# Patient Record
Sex: Male | Born: 1980 | Race: White | Hispanic: No | Marital: Single | Smoking: Former smoker
Health system: Southern US, Community
[De-identification: ages and names within clinical notes are randomized; demographics above are authoritative.]

## PROBLEM LIST (undated history)

## (undated) DIAGNOSIS — R6882 Decreased libido: Secondary | ICD-10-CM

## (undated) HISTORY — PX: APPENDECTOMY: SHX54

## (undated) HISTORY — DX: Decreased libido: R68.82

---

## 2015-05-11 ENCOUNTER — Ambulatory Visit (INDEPENDENT_AMBULATORY_CARE_PROVIDER_SITE_OTHER): Payer: Managed Care, Other (non HMO) | Admitting: Family Medicine

## 2015-05-11 ENCOUNTER — Encounter: Payer: Self-pay | Admitting: Family Medicine

## 2015-05-11 ENCOUNTER — Ambulatory Visit (INDEPENDENT_AMBULATORY_CARE_PROVIDER_SITE_OTHER): Payer: Managed Care, Other (non HMO)

## 2015-05-11 VITALS — BP 126/76 | HR 72 | Temp 98.7°F | Resp 16 | Ht 70.25 in | Wt 175.6 lb

## 2015-05-11 DIAGNOSIS — M542 Cervicalgia: Secondary | ICD-10-CM

## 2015-05-11 DIAGNOSIS — M546 Pain in thoracic spine: Secondary | ICD-10-CM

## 2015-05-11 DIAGNOSIS — M549 Dorsalgia, unspecified: Secondary | ICD-10-CM

## 2015-05-11 MED ORDER — MELOXICAM 7.5 MG PO TABS: 7.5000 mg | ORAL_TABLET | Freq: Every day | ORAL | Status: DC

## 2015-05-11 MED ORDER — CYCLOBENZAPRINE HCL 5 MG PO TABS: ORAL_TABLET | ORAL | Status: DC

## 2015-05-11 NOTE — Progress Notes (Signed)
Subjective:  By signing my name below, I, Stann Ore, attest that this documentation has been prepared under the direction and in the presence of [name], MD. Electronically Signed: Stann Ore, Scribe. 05/11/2015 , 3:32 PM .  Patient was seen in Room 26 .   Patient ID: Michael Castaneda, male    DOB: 02-10-1981, 35 y.o.   MRN: [id number] Chief Complaint  Patient presents with  . right upper back muscle pain    x 2 yrs, "just got insurance to be able to come in"   HPI Michael Castaneda is a 35 y.o. male Pt is here with upper right myalgia in his shoulder for the past 2 years. He was doing a deadlift at the gym and felt a discomfort when he was coming up. He still does exercises but just painful. He notices most when he lifts heavier weights and using the shoulder. He's taken ibuprofen qd and tylenol without relief. He's also tried applying bengay cream without relief. He's also tried massage therapy without relief. He also mentions going to "[facility]" for his spine without resolve. He denies imaging done on the area. He denies pain radiating down his arm or to his neck. He hasn't seen a doctor for this due to having no insurance.   He's right handed.  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   There are no active problems to display for this patient.  History reviewed. No pertinent past medical history. Past Surgical History  Procedure Laterality Date  . Appendectomy     No Known Allergies Prior to Admission medications   Not on File   Social History   Social History  . Marital Status: Single    Spouse Name: N/A  . Number of Children: N/A  . Years of Education: N/A   Occupational History  . Not on file.   Social History Main Topics  . Smoking status: Current Every Day Smoker  . Smokeless tobacco: Not on file  . Alcohol Use: No  . Drug Use: No  . Sexual Activity: Not on file   Other Topics Concern  . Not on file   Social History  Narrative  . No narrative on file   Review of Systems  Constitutional: Negative for fever, chills, diaphoresis and fatigue.  Musculoskeletal: Positive for myalgias and arthralgias (right shoulder). Negative for back pain, joint swelling, neck pain and neck stiffness.  Skin: Negative for rash and wound.  Neurological: Negative for light-headedness and headaches.      Objective:   Physical Exam  Constitutional: He is oriented to person, place, and time. He appears well-developed and well-nourished. No distress.  HENT:  Head: Normocephalic and atraumatic.  Eyes: EOM are normal. Pupils are equal, round, and reactive to light.  Neck: Neck supple.  Cardiovascular: Normal rate.   Pulmonary/Chest: Effort normal. No respiratory distress.  Musculoskeletal: Normal range of motion.  c-spine exam: full extension and flexion, left rotation and right rotation intact, slight pinch lateral flexion neck, no bony tenderness Right shoulder: No scapula winging, equal and normal scapulothoracic motion and no apparent periscapular atrophy Upper extremity: strength intact bilaterally, NVI distally  Neurological: He is alert and oriented to person, place, and time.  Reflex Scores:      Tricep reflexes are 1+ on the right side and 1+ on the left side.      Bicep reflexes are 1+ on the right side and 1+ on the left side. 1+ biceps triceps reflexes but  equal   Skin: Skin is warm and dry.  Psychiatric: He has a normal mood and affect. His behavior is normal.  Nursing note and vitals reviewed.   Filed Vitals:   05/11/15 1443  BP: 126/76  Pulse: 72  Temp: 98.7 F (37.1 C)  TempSrc: Oral  Resp: 16  Height: 5' 10.25" (1.784 m)  Weight: 175 lb 9.6 oz (79.652 kg)  SpO2: 97%   [facility] reading (PRIMARY) by Dr. Neva Seat : c-spine xray: some straightening of upper cervical spine with some diffuse degenerative changes, no apparent fractures     Assessment & Plan:    Michael Castaneda is a 35 y.o. male Upper back  pain on right side - Plan: DG Cervical Spine 2 or 3 views, meloxicam (MOBIC) 7.5 MG tablet, cyclobenzaprine (FLEXERIL) 5 MG tablet  Neck pain on right side - Plan: DG Cervical Spine 2 or 3 views, meloxicam (MOBIC) 7.5 MG tablet, cyclobenzaprine (FLEXERIL) 5 MG tablet  Possible C-spine source versus strain upper thoracic spine. No apparent supraspinatus or rhomboid atrophy, full range of motion, strength appears intact. Will try meloxicam every morning, Flexeril at night as needed. Side effects discussed, other symptomatic care discussed as well as progressing to home exercises on handout. Consider formal physical therapy or orthopedics if not improving the next 4-6 weeks. Sooner if worse.  Meds ordered this encounter  Medications  . meloxicam (MOBIC) 7.5 MG tablet    Sig: Take 1 tablet (7.5 mg total) by mouth daily.    Dispense:  30 tablet    Refill:  0  . cyclobenzaprine (FLEXERIL) 5 MG tablet    Sig: 1 pill by mouth up to every 8 hours as needed. Start with one pill by mouth each bedtime as needed due to sedation    Dispense:  15 tablet    Refill:  0   Patient Instructions  You likely had a sprained ligament or strained muscle in the upper back or possible pinched nerve from neck, which can lead to some muscle spasm as well. Try the mobic each morning (do not combine with other over the counter pain relievers), flexeril at night if needed.  Heat or ice to area as needed and gentle range of motion/stretching throughout the day. You can also try to progress to exercises below as tolerated. If not improving in next 4-6 weeks, I can refer you to orthopaedist or physical therapy. Return to the clinic or go to the nearest emergency room if any of your symptoms worsen or new symptoms occur.  Follow up to discuss other concern form today so we can determine if blood work or other testing/medication indicated.   Cervical Strain and Sprain With Rehab Cervical strain and sprain are injuries that  commonly occur with "whiplash" injuries. Whiplash occurs when the neck is forcefully whipped backward or forward, such as during a motor vehicle accident or during contact sports. The muscles, ligaments, tendons, discs, and nerves of the neck are susceptible to injury when this occurs. RISK FACTORS Risk of having a whiplash injury increases if:  Osteoarthritis of the spine.  Situations that make head or neck accidents or trauma more likely.  High-risk sports (football, rugby, wrestling, hockey, auto racing, gymnastics, diving, contact karate, or boxing).  Poor strength and flexibility of the neck.  Previous neck injury.  Poor tackling technique.  Improperly fitted or padded equipment. SYMPTOMS   Pain or stiffness in the front or back of neck or both.  Symptoms may present immediately or up to  24 hours after injury.  Dizziness, headache, nausea, and vomiting.  Muscle spasm with soreness and stiffness in the neck.  Tenderness and swelling at the injury site. PREVENTION  Learn and use proper technique (avoid tackling with the head, spearing, and head-butting; use proper falling techniques to avoid landing on the head).  Warm up and stretch properly before activity.  Maintain physical fitness:  Strength, flexibility, and endurance.  Cardiovascular fitness.  Wear properly fitted and padded protective equipment, such as padded soft collars, for participation in contact sports. PROGNOSIS  Recovery from cervical strain and sprain injuries is dependent on the extent of the injury. These injuries are usually curable in 1 week to 3 months with appropriate treatment.  RELATED COMPLICATIONS   Temporary numbness and weakness may occur if the nerve roots are damaged, and this may persist until the nerve has completely healed.  Chronic pain due to frequent recurrence of symptoms.  Prolonged healing, especially if activity is resumed too soon (before complete recovery). TREATMENT    Treatment initially involves the use of ice and medication to help reduce pain and inflammation. It is also important to perform strengthening and stretching exercises and modify activities that worsen symptoms so the injury does not get worse. These exercises may be performed at home or with a therapist. For patients who experience severe symptoms, a soft, padded collar may be recommended to be worn around the neck.  Improving your posture may help reduce symptoms. Posture improvement includes pulling your chin and abdomen in while sitting or standing. If you are sitting, sit in a firm chair with your buttocks against the back of the chair. While sleeping, try replacing your pillow with a small towel rolled to 2 inches in diameter, or use a cervical pillow or soft cervical collar. Poor sleeping positions delay healing.  For patients with nerve root damage, which causes numbness or weakness, the use of a cervical traction apparatus may be recommended. Surgery is rarely necessary for these injuries. However, cervical strain and sprains that are present at birth (congenital) may require surgery. MEDICATION   If pain medication is necessary, nonsteroidal anti-inflammatory medications, such as aspirin and ibuprofen, or other minor pain relievers, such as acetaminophen, are often recommended.  Do not take pain medication for 7 days before surgery.  Prescription pain relievers may be given if deemed necessary by your caregiver. Use only as directed and only as much as you need. HEAT AND COLD:   Cold treatment (icing) relieves pain and reduces inflammation. Cold treatment should be applied for 10 to 15 minutes every 2 to 3 hours for inflammation and pain and immediately after any activity that aggravates your symptoms. Use ice packs or an ice massage.  Heat treatment may be used prior to performing the stretching and strengthening activities prescribed by your caregiver, physical therapist, or athletic  trainer. Use a heat pack or a warm soak. SEEK MEDICAL CARE IF:   Symptoms get worse or do not improve in 2 weeks despite treatment.  New, unexplained symptoms develop (drugs used in treatment may produce side effects). EXERCISES RANGE OF MOTION (ROM) AND STRETCHING EXERCISES - Cervical Strain and Sprain These exercises may help you when beginning to rehabilitate your injury. In order to successfully resolve your symptoms, you must improve your posture. These exercises are designed to help reduce the forward-head and rounded-shoulder posture which contributes to this condition. Your symptoms may resolve with or without further involvement from your physician, physical therapist or athletic trainer. While completing  these exercises, remember:   Restoring tissue flexibility helps normal motion to return to the joints. This allows healthier, less painful movement and activity.  An effective stretch should be held for at least 20 seconds, although you may need to begin with shorter hold times for comfort.  A stretch should never be painful. You should only feel a gentle lengthening or release in the stretched tissue. STRETCH- Axial Extensors  Lie on your back on the floor. You may bend your knees for comfort. Place a rolled-up hand towel or dish towel, about 2 inches in diameter, under the part of your head that makes contact with the floor.  Gently tuck your chin, as if trying to make a "double chin," until you feel a gentle stretch at the base of your head.  Hold __________ seconds. Repeat __________ times. Complete this exercise __________ times per day.  STRETCH - Axial Extension   Stand or sit on a firm surface. Assume a good posture: chest up, shoulders drawn back, abdominal muscles slightly tense, knees unlocked (if standing) and feet hip width apart.  Slowly retract your chin so your head slides back and your chin slightly lowers. Continue to look straight ahead.  You should feel a  gentle stretch in the back of your head. Be certain not to feel an aggressive stretch since this can cause headaches later.  Hold for __________ seconds. Repeat __________ times. Complete this exercise __________ times per day. STRETCH - Cervical Side Bend   Stand or sit on a firm surface. Assume a good posture: chest up, shoulders drawn back, abdominal muscles slightly tense, knees unlocked (if standing) and feet hip width apart.  Without letting your nose or shoulders move, slowly tip your right / left ear to your shoulder until your feel a gentle stretch in the muscles on the opposite side of your neck.  Hold __________ seconds. Repeat __________ times. Complete this exercise __________ times per day. STRETCH - Cervical Rotators   Stand or sit on a firm surface. Assume a good posture: chest up, shoulders drawn back, abdominal muscles slightly tense, knees unlocked (if standing) and feet hip width apart.  Keeping your eyes level with the ground, slowly turn your head until you feel a gentle stretch along the back and opposite side of your neck.  Hold __________ seconds. Repeat __________ times. Complete this exercise __________ times per day. RANGE OF MOTION - Neck Circles   Stand or sit on a firm surface. Assume a good posture: chest up, shoulders drawn back, abdominal muscles slightly tense, knees unlocked (if standing) and feet hip width apart.  Gently roll your head down and around from the back of one shoulder to the back of the other. The motion should never be forced or painful.  Repeat the motion 10-20 times, or until you feel the neck muscles relax and loosen. Repeat __________ times. Complete the exercise __________ times per day. STRENGTHENING EXERCISES - Cervical Strain and Sprain These exercises may help you when beginning to rehabilitate your injury. They may resolve your symptoms with or without further involvement from your physician, physical therapist, or athletic  trainer. While completing these exercises, remember:   Muscles can gain both the endurance and the strength needed for everyday activities through controlled exercises.  Complete these exercises as instructed by your physician, physical therapist, or athletic trainer. Progress the resistance and repetitions only as guided.  You may experience muscle soreness or fatigue, but the pain or discomfort you are trying to eliminate  should never worsen during these exercises. If this pain does worsen, stop and make certain you are following the directions exactly. If the pain is still present after adjustments, discontinue the exercise until you can discuss the trouble with your clinician. STRENGTH - Cervical Flexors, Isometric  Face a wall, standing about 6 inches away. Place a small pillow, a ball about 6-8 inches in diameter, or a folded towel between your forehead and the wall.  Slightly tuck your chin and gently push your forehead into the soft object. Push only with mild to moderate intensity, building up tension gradually. Keep your jaw and forehead relaxed.  Hold 10 to 20 seconds. Keep your breathing relaxed.  Release the tension slowly. Relax your neck muscles completely before you start the next repetition. Repeat __________ times. Complete this exercise __________ times per day. STRENGTH- Cervical Lateral Flexors, Isometric   Stand about 6 inches away from a wall. Place a small pillow, a ball about 6-8 inches in diameter, or a folded towel between the side of your head and the wall.  Slightly tuck your chin and gently tilt your head into the soft object. Push only with mild to moderate intensity, building up tension gradually. Keep your jaw and forehead relaxed.  Hold 10 to 20 seconds. Keep your breathing relaxed.  Release the tension slowly. Relax your neck muscles completely before you start the next repetition. Repeat __________ times. Complete this exercise __________ times per  day. STRENGTH - Cervical Extensors, Isometric   Stand about 6 inches away from a wall. Place a small pillow, a ball about 6-8 inches in diameter, or a folded towel between the back of your head and the wall.  Slightly tuck your chin and gently tilt your head back into the soft object. Push only with mild to moderate intensity, building up tension gradually. Keep your jaw and forehead relaxed.  Hold 10 to 20 seconds. Keep your breathing relaxed.  Release the tension slowly. Relax your neck muscles completely before you start the next repetition. Repeat __________ times. Complete this exercise __________ times per day. POSTURE AND BODY MECHANICS CONSIDERATIONS - Cervical Strain and Sprain Keeping correct posture when sitting, standing or completing your activities will reduce the stress put on different body tissues, allowing injured tissues a chance to heal and limiting painful experiences. The following are general guidelines for improved posture. Your physician or physical therapist will provide you with any instructions specific to your needs. While reading these guidelines, remember:  The exercises prescribed by your provider will help you have the flexibility and strength to maintain correct postures.  The correct posture provides the optimal environment for your joints to work. All of your joints have less wear and tear when properly supported by a spine with good posture. This means you will experience a healthier, less painful body.  Correct posture must be practiced with all of your activities, especially prolonged sitting and standing. Correct posture is as important when doing repetitive low-stress activities (typing) as it is when doing a single heavy-load activity (lifting). PROLONGED STANDING WHILE SLIGHTLY LEANING FORWARD When completing a task that requires you to lean forward while standing in one place for a long time, place either foot up on a stationary 2- to 4-inch high object  to help maintain the best posture. When both feet are on the ground, the low back tends to lose its slight inward curve. If this curve flattens (or becomes too large), then the back and your other joints will experience  too much stress, fatigue more quickly, and can cause pain.  RESTING POSITIONS Consider which positions are most painful for you when choosing a resting position. If you have pain with flexion-based activities (sitting, bending, stooping, squatting), choose a position that allows you to rest in a less flexed posture. You would want to avoid curling into a fetal position on your side. If your pain worsens with extension-based activities (prolonged standing, working overhead), avoid resting in an extended position such as sleeping on your stomach. Most people will find more comfort when they rest with their spine in a more neutral position, neither too rounded nor too arched. Lying on a non-sagging bed on your side with a pillow between your knees, or on your back with a pillow under your knees will often provide some relief. Keep in mind, being in any one position for a prolonged period of time, no matter how correct your posture, can still lead to stiffness. WALKING Walk with an upright posture. Your ears, shoulders, and hips should all line up. OFFICE WORK When working at a desk, create an environment that supports good, upright posture. Without extra support, muscles fatigue and lead to excessive strain on joints and other tissues. CHAIR:  A chair should be able to slide under your desk when your back makes contact with the back of the chair. This allows you to work closely.  The chair's height should allow your eyes to be level with the upper part of your monitor and your hands to be slightly lower than your elbows.  Body position:  Your feet should make contact with the floor. If this is not possible, use a foot rest.  Keep your ears over your shoulders. This will reduce stress on  your neck and low back.   This information is not intended to replace advice given to you by your health care provider. Make sure you discuss any questions you have with your health care provider.   Document Released: 03/28/2005 Document Revised: 04/18/2014 Document Reviewed: 07/10/2008 [employer] Interactive Patient Education [employer]   Because you received an x-ray today, you will receive an invoice from [facility]. Please contact [facility] at [phone] with questions or concerns regarding your invoice. Our billing staff will not be able to assist you with those questions.     I personally performed the services described in this documentation, which was scribed in my presence. The recorded information has been reviewed and considered, and addended by me as needed.

## 2015-05-11 NOTE — Patient Instructions (Addendum)
You likely had a sprained ligament or strained muscle in the upper back or possible pinched nerve from neck, which can lead to some muscle spasm as well. Try the mobic each morning (do not combine with other over the counter pain relievers), flexeril at night if needed.  Heat or ice to area as needed and gentle range of motion/stretching throughout the day. You can also try to progress to exercises below as tolerated. If not improving in next 4-6 weeks, I can refer you to orthopaedist or physical therapy. Return to the clinic or go to the nearest emergency room if any of your symptoms worsen or new symptoms occur.  Follow up to discuss other concern form today so we can determine if blood work or other testing/medication indicated.   Cervical Strain and Sprain With Rehab Cervical strain and sprain are injuries that commonly occur with "whiplash" injuries. Whiplash occurs when the neck is forcefully whipped backward or forward, such as during a motor vehicle accident or during contact sports. The muscles, ligaments, tendons, discs, and nerves of the neck are susceptible to injury when this occurs. RISK FACTORS Risk of having a whiplash injury increases if:  Osteoarthritis of the spine.  Situations that make head or neck accidents or trauma more likely.  High-risk sports (football, rugby, wrestling, hockey, auto racing, gymnastics, diving, contact karate, or boxing).  Poor strength and flexibility of the neck.  Previous neck injury.  Poor tackling technique.  Improperly fitted or padded equipment. SYMPTOMS   Pain or stiffness in the front or back of neck or both.  Symptoms may present immediately or up to 24 hours after injury.  Dizziness, headache, nausea, and vomiting.  Muscle spasm with soreness and stiffness in the neck.  Tenderness and swelling at the injury site. PREVENTION  Learn and use proper technique (avoid tackling with the head, spearing, and head-butting; use proper  falling techniques to avoid landing on the head).  Warm up and stretch properly before activity.  Maintain physical fitness:  Strength, flexibility, and endurance.  Cardiovascular fitness.  Wear properly fitted and padded protective equipment, such as padded soft collars, for participation in contact sports. PROGNOSIS  Recovery from cervical strain and sprain injuries is dependent on the extent of the injury. These injuries are usually curable in 1 week to 3 months with appropriate treatment.  RELATED COMPLICATIONS   Temporary numbness and weakness may occur if the nerve roots are damaged, and this may persist until the nerve has completely healed.  Chronic pain due to frequent recurrence of symptoms.  Prolonged healing, especially if activity is resumed too soon (before complete recovery). TREATMENT  Treatment initially involves the use of ice and medication to help reduce pain and inflammation. It is also important to perform strengthening and stretching exercises and modify activities that worsen symptoms so the injury does not get worse. These exercises may be performed at home or with a therapist. For patients who experience severe symptoms, a soft, padded collar may be recommended to be worn around the neck.  Improving your posture may help reduce symptoms. Posture improvement includes pulling your chin and abdomen in while sitting or standing. If you are sitting, sit in a firm chair with your buttocks against the back of the chair. While sleeping, try replacing your pillow with a small towel rolled to 2 inches in diameter, or use a cervical pillow or soft cervical collar. Poor sleeping positions delay healing.  For patients with nerve root damage, which causes numbness or  weakness, the use of a cervical traction apparatus may be recommended. Surgery is rarely necessary for these injuries. However, cervical strain and sprains that are present at birth (congenital) may require  surgery. MEDICATION   If pain medication is necessary, nonsteroidal anti-inflammatory medications, such as aspirin and ibuprofen, or other minor pain relievers, such as acetaminophen, are often recommended.  Do not take pain medication for 7 days before surgery.  Prescription pain relievers may be given if deemed necessary by your caregiver. Use only as directed and only as much as you need. HEAT AND COLD:   Cold treatment (icing) relieves pain and reduces inflammation. Cold treatment should be applied for 10 to 15 minutes every 2 to 3 hours for inflammation and pain and immediately after any activity that aggravates your symptoms. Use ice packs or an ice massage.  Heat treatment may be used prior to performing the stretching and strengthening activities prescribed by your caregiver, physical therapist, or athletic trainer. Use a heat pack or a warm soak. SEEK MEDICAL CARE IF:   Symptoms get worse or do not improve in 2 weeks despite treatment.  New, unexplained symptoms develop (drugs used in treatment may produce side effects). EXERCISES RANGE OF MOTION (ROM) AND STRETCHING EXERCISES - Cervical Strain and Sprain These exercises may help you when beginning to rehabilitate your injury. In order to successfully resolve your symptoms, you must improve your posture. These exercises are designed to help reduce the forward-head and rounded-shoulder posture which contributes to this condition. Your symptoms may resolve with or without further involvement from your physician, physical therapist or athletic trainer. While completing these exercises, remember:   Restoring tissue flexibility helps normal motion to return to the joints. This allows healthier, less painful movement and activity.  An effective stretch should be held for at least 20 seconds, although you may need to begin with shorter hold times for comfort.  A stretch should never be painful. You should only feel a gentle lengthening or  release in the stretched tissue. STRETCH- Axial Extensors  Lie on your back on the floor. You may bend your knees for comfort. Place a rolled-up hand towel or dish towel, about 2 inches in diameter, under the part of your head that makes contact with the floor.  Gently tuck your chin, as if trying to make a "double chin," until you feel a gentle stretch at the base of your head.  Hold __________ seconds. Repeat __________ times. Complete this exercise __________ times per day.  STRETCH - Axial Extension   Stand or sit on a firm surface. Assume a good posture: chest up, shoulders drawn back, abdominal muscles slightly tense, knees unlocked (if standing) and feet hip width apart.  Slowly retract your chin so your head slides back and your chin slightly lowers. Continue to look straight ahead.  You should feel a gentle stretch in the back of your head. Be certain not to feel an aggressive stretch since this can cause headaches later.  Hold for __________ seconds. Repeat __________ times. Complete this exercise __________ times per day. STRETCH - Cervical Side Bend   Stand or sit on a firm surface. Assume a good posture: chest up, shoulders drawn back, abdominal muscles slightly tense, knees unlocked (if standing) and feet hip width apart.  Without letting your nose or shoulders move, slowly tip your right / left ear to your shoulder until your feel a gentle stretch in the muscles on the opposite side of your neck.  Hold __________ seconds.  Repeat __________ times. Complete this exercise __________ times per day. STRETCH - Cervical Rotators   Stand or sit on a firm surface. Assume a good posture: chest up, shoulders drawn back, abdominal muscles slightly tense, knees unlocked (if standing) and feet hip width apart.  Keeping your eyes level with the ground, slowly turn your head until you feel a gentle stretch along the back and opposite side of your neck.  Hold __________  seconds. Repeat __________ times. Complete this exercise __________ times per day. RANGE OF MOTION - Neck Circles   Stand or sit on a firm surface. Assume a good posture: chest up, shoulders drawn back, abdominal muscles slightly tense, knees unlocked (if standing) and feet hip width apart.  Gently roll your head down and around from the back of one shoulder to the back of the other. The motion should never be forced or painful.  Repeat the motion 10-20 times, or until you feel the neck muscles relax and loosen. Repeat __________ times. Complete the exercise __________ times per day. STRENGTHENING EXERCISES - Cervical Strain and Sprain These exercises may help you when beginning to rehabilitate your injury. They may resolve your symptoms with or without further involvement from your physician, physical therapist, or athletic trainer. While completing these exercises, remember:   Muscles can gain both the endurance and the strength needed for everyday activities through controlled exercises.  Complete these exercises as instructed by your physician, physical therapist, or athletic trainer. Progress the resistance and repetitions only as guided.  You may experience muscle soreness or fatigue, but the pain or discomfort you are trying to eliminate should never worsen during these exercises. If this pain does worsen, stop and make certain you are following the directions exactly. If the pain is still present after adjustments, discontinue the exercise until you can discuss the trouble with your clinician. STRENGTH - Cervical Flexors, Isometric  Face a wall, standing about 6 inches away. Place a small pillow, a ball about 6-8 inches in diameter, or a folded towel between your forehead and the wall.  Slightly tuck your chin and gently push your forehead into the soft object. Push only with mild to moderate intensity, building up tension gradually. Keep your jaw and forehead relaxed.  Hold 10 to 20  seconds. Keep your breathing relaxed.  Release the tension slowly. Relax your neck muscles completely before you start the next repetition. Repeat __________ times. Complete this exercise __________ times per day. STRENGTH- Cervical Lateral Flexors, Isometric   Stand about 6 inches away from a wall. Place a small pillow, a ball about 6-8 inches in diameter, or a folded towel between the side of your head and the wall.  Slightly tuck your chin and gently tilt your head into the soft object. Push only with mild to moderate intensity, building up tension gradually. Keep your jaw and forehead relaxed.  Hold 10 to 20 seconds. Keep your breathing relaxed.  Release the tension slowly. Relax your neck muscles completely before you start the next repetition. Repeat __________ times. Complete this exercise __________ times per day. STRENGTH - Cervical Extensors, Isometric   Stand about 6 inches away from a wall. Place a small pillow, a ball about 6-8 inches in diameter, or a folded towel between the back of your head and the wall.  Slightly tuck your chin and gently tilt your head back into the soft object. Push only with mild to moderate intensity, building up tension gradually. Keep your jaw and forehead relaxed.  Hold  10 to 20 seconds. Keep your breathing relaxed.  Release the tension slowly. Relax your neck muscles completely before you start the next repetition. Repeat __________ times. Complete this exercise __________ times per day. POSTURE AND BODY MECHANICS CONSIDERATIONS - Cervical Strain and Sprain Keeping correct posture when sitting, standing or completing your activities will reduce the stress put on different body tissues, allowing injured tissues a chance to heal and limiting painful experiences. The following are general guidelines for improved posture. Your physician or physical therapist will provide you with any instructions specific to your needs. While reading these guidelines,  remember:  The exercises prescribed by your provider will help you have the flexibility and strength to maintain correct postures.  The correct posture provides the optimal environment for your joints to work. All of your joints have less wear and tear when properly supported by a spine with good posture. This means you will experience a healthier, less painful body.  Correct posture must be practiced with all of your activities, especially prolonged sitting and standing. Correct posture is as important when doing repetitive low-stress activities (typing) as it is when doing a single heavy-load activity (lifting). PROLONGED STANDING WHILE SLIGHTLY LEANING FORWARD When completing a task that requires you to lean forward while standing in one place for a long time, place either foot up on a stationary 2- to 4-inch high object to help maintain the best posture. When both feet are on the ground, the low back tends to lose its slight inward curve. If this curve flattens (or becomes too large), then the back and your other joints will experience too much stress, fatigue more quickly, and can cause pain.  RESTING POSITIONS Consider which positions are most painful for you when choosing a resting position. If you have pain with flexion-based activities (sitting, bending, stooping, squatting), choose a position that allows you to rest in a less flexed posture. You would want to avoid curling into a fetal position on your side. If your pain worsens with extension-based activities (prolonged standing, working overhead), avoid resting in an extended position such as sleeping on your stomach. Most people will find more comfort when they rest with their spine in a more neutral position, neither too rounded nor too arched. Lying on a non-sagging bed on your side with a pillow between your knees, or on your back with a pillow under your knees will often provide some relief. Keep in mind, being in any one position for a  prolonged period of time, no matter how correct your posture, can still lead to stiffness. WALKING Walk with an upright posture. Your ears, shoulders, and hips should all line up. OFFICE WORK When working at a desk, create an environment that supports good, upright posture. Without extra support, muscles fatigue and lead to excessive strain on joints and other tissues. CHAIR:  A chair should be able to slide under your desk when your back makes contact with the back of the chair. This allows you to work closely.  The chair's height should allow your eyes to be level with the upper part of your monitor and your hands to be slightly lower than your elbows.  Body position:  Your feet should make contact with the floor. If this is not possible, use a foot rest.  Keep your ears over your shoulders. This will reduce stress on your neck and low back.   This information is not intended to replace advice given to you by your health care provider. Make  sure you discuss any questions you have with your health care provider.   Document Released: 03/28/2005 Document Revised: 04/18/2014 Document Reviewed: 07/10/2008 Else[employer] Interactive Patient Education [employer]   Because you received an x-ray today, you will receive an invoice from [facility]. Please contact [facility] at [phone] with questions or concerns regarding your invoice. Our billing staff will not be able to assist you with those questions.

## 2015-05-27 ENCOUNTER — Encounter: Payer: Self-pay | Admitting: Family Medicine

## 2015-05-27 ENCOUNTER — Ambulatory Visit (INDEPENDENT_AMBULATORY_CARE_PROVIDER_SITE_OTHER): Payer: Managed Care, Other (non HMO) | Admitting: Family Medicine

## 2015-05-27 VITALS — BP 126/78 | HR 82 | Temp 98.0°F | Resp 16 | Ht 70.0 in | Wt 179.2 lb

## 2015-05-27 DIAGNOSIS — M542 Cervicalgia: Secondary | ICD-10-CM

## 2015-05-27 DIAGNOSIS — N529 Male erectile dysfunction, unspecified: Secondary | ICD-10-CM | POA: Diagnosis not present

## 2015-05-27 DIAGNOSIS — R6882 Decreased libido: Secondary | ICD-10-CM | POA: Diagnosis not present

## 2015-05-27 DIAGNOSIS — M546 Pain in thoracic spine: Secondary | ICD-10-CM | POA: Diagnosis not present

## 2015-05-27 DIAGNOSIS — M549 Dorsalgia, unspecified: Secondary | ICD-10-CM

## 2015-05-27 MED ORDER — MELOXICAM 7.5 MG PO TABS: 7.5000 mg | ORAL_TABLET | Freq: Every day | ORAL | Status: DC

## 2015-05-27 MED ORDER — SILDENAFIL CITRATE 20 MG PO TABS: ORAL_TABLET | ORAL | Status: DC

## 2015-05-27 NOTE — Progress Notes (Signed)
Subjective:    Patient ID: Michael Castaneda, male    DOB: May 31, 1980, 35 y.o.   MRN: [id number] By signing my name below, I, Michael Castaneda, attest that this documentation has been prepared under the direction and in the presence of Michael Staggers, MD.  Electronically Signed: Littie Castaneda, Medical Scribe. 05/27/2015. 5:11 PM.  HPI HPI Comments: Michael Castaneda is a 35 y.o. male who presents to the [facility] for difficulty with erections.  Difficulty with erections: Patient notes he is able to get an erection sometimes, but it is difficult to maintain. Sometimes he has difficulty obtaining erections. He has had this problem for about 2 years. He occasionally wakes up with an erection. He has also had decreased libido over the past 2 years. Patient notes he has had increased stress with work since 2 years ago; he is now working full-time at a more demanding job (he previously held multiple part-time jobs which was less stressful). He has been with his current partner for 8-9 months. He has tried OTC remedies such as horny goat weed and a testosterone booster but without relief. Patient denies groin injury and supplement use.  Neck pain: Seen last time for neck pain. The pain does not radiate into his arms. Started on meloxicam and flexeril. Patient notes the neck pain has not improved. He has been taking the meloxicam twice a day and the flexeril sometimes at night (not every day). He did try some of the exercises I gave him. His partner gave him a massage which helped with the pain for about a day and a half. Negative C-spine XR on 1/30.  There are no active problems to display for this patient.  No past medical history on file. Past Surgical History  Procedure Laterality Date  . Appendectomy     Not on File Prior to Admission medications   Medication Sig Start Date End Date Taking? Authorizing Provider  cyclobenzaprine (FLEXERIL) 5 MG tablet 1 pill by mouth up to every 8 hours as  needed. Start with one pill by mouth each bedtime as needed due to sedation 05/11/15   Shade Flood, MD  meloxicam (MOBIC) 7.5 MG tablet Take 1 tablet (7.5 mg total) by mouth daily. 05/11/15   Shade Flood, MD   Social History   Social History  . Marital Status: Single    Spouse Name: N/A  . Number of Children: N/A  . Years of Education: N/A   Occupational History  . Not on file.   Social History Main Topics  . Smoking status: Current Every Day Smoker  . Smokeless tobacco: Not on file  . Alcohol Use: No  . Drug Use: No  . Sexual Activity: Not on file   Other Topics Concern  . Not on file   Social History Narrative     Review of Systems  Musculoskeletal: Positive for neck pain.       Objective:   Physical Exam  Constitutional: He is oriented to person, place, and time. He appears well-developed and well-nourished. No distress.  HENT:  Head: Normocephalic and atraumatic.  Mouth/Throat: Oropharynx is clear and moist. No oropharyngeal exudate.  Eyes: Pupils are equal, round, and reactive to light.  Neck: Neck supple.  No focal tenderness.  Cardiovascular: Normal rate.   Pulmonary/Chest: Effort normal.  Musculoskeletal: He exhibits no edema.  Neurological: He is alert and oriented to person, place, and time. No cranial nerve deficit.  Skin: Skin is warm and dry. No  rash noted.  Psychiatric: He has a normal mood and affect. His behavior is normal.  Nursing note and vitals reviewed.   Filed Vitals:   05/27/15 1637  BP: 126/78  Pulse: 82  Temp: 98 F (36.7 C)  TempSrc: Oral  Resp: 16  Height:  (1.778 m)  Weight: 179 lb 3.2 oz (81.285 kg)         Assessment & Plan:   [name] is a 35 y.o. male Upper back pain on right side - Plan: meloxicam (MOBIC) 7.5 MG tablet Neck pain on right side - Plan: meloxicam (MOBIC) 7.5 MG tablet  -Persistent, but has only been on 2 weeks of treatment.   -Continue meloxicam 7.5 mg 1-2 daily, heat, gentle stretches  and range of motion, then if not improving the next few weeks, can call for referral for physical therapy or orthopedics if he prefers. RTC Sooner if worse.  Decreased libido, Erectile dysfunction, unspecified erectile dysfunction type - Plan: Testosterone, sildenafil (REVATIO) 20 MG tablet  -Check testosterone - morning lab discussed, lab only order placed. Also advised need for repeat if single low reading to verify true low testosterone.  -sildenafil Rx given - use lowest effective dose. Side effects discussed (including but not limited to headache/flushing, blue discoloration of vision, possible vascular steal and risk of cardiac effects if underlying unknown coronary artery disease, and permanent sensorineural hearing loss). Initially discussed off label use of Revatio in place of brand-name Viagra. Understanding expressed.     Meds ordered this encounter  Medications  . meloxicam (MOBIC) 7.5 MG tablet    Sig: Take 1-2 tablets (7.5-15 mg total) by mouth daily.    Dispense:  45 tablet    Refill:  0  . sildenafil (REVATIO) 20 MG tablet    Sig: 1-2 tablets by mouth prior to onset of sexual activity.    Dispense:  20 tablet    Refill:  0   Patient Instructions  Continue mobic one to two each day, massage and range of motion if this feels better, and heat as discussed. [employer] if needed at work.  If not improving in next 2-3 weeks - let me know and I can refer you to either ortho or physical therapy.   Sildenafil 1-2 pills 15-30 minutes prior to onset of sexaul activity.  Return for testosterone test in a morning.   You should receive a call or letter about your lab results within the next week to 10 days.      I personally performed the services described in this documentation, which was scribed in my presence. The recorded information has been reviewed and considered, and addended by me as needed.

## 2015-05-27 NOTE — Patient Instructions (Addendum)
Continue mobic one to two each day, massage and range of motion if this feels better, and heat as discussed. [employer] if needed at work.  If not improving in next 2-3 weeks - let me know and I can refer you to either ortho or physical therapy.   Sildenafil 1-2 pills 15-30 minutes prior to onset of sexaul activity.  Return for testosterone test in a morning.   You should receive a call or letter about your lab results within the next week to 10 days.

## 2015-05-29 ENCOUNTER — Encounter: Payer: Self-pay | Admitting: Family Medicine

## 2015-06-04 ENCOUNTER — Other Ambulatory Visit (INDEPENDENT_AMBULATORY_CARE_PROVIDER_SITE_OTHER): Payer: Managed Care, Other (non HMO)

## 2015-06-04 DIAGNOSIS — R6882 Decreased libido: Secondary | ICD-10-CM

## 2015-06-04 DIAGNOSIS — N529 Male erectile dysfunction, unspecified: Secondary | ICD-10-CM | POA: Diagnosis not present

## 2015-06-04 LAB — TESTOSTERONE: TESTOSTERONE: 316 ng/dL (ref 250–827)

## 2015-07-29 ENCOUNTER — Ambulatory Visit (INDEPENDENT_AMBULATORY_CARE_PROVIDER_SITE_OTHER): Payer: Managed Care, Other (non HMO) | Admitting: Family Medicine

## 2015-07-29 ENCOUNTER — Encounter: Payer: Self-pay | Admitting: Family Medicine

## 2015-07-29 VITALS — BP 134/76 | HR 98 | Temp 98.1°F | Resp 16 | Ht 70.5 in | Wt 174.4 lb

## 2015-07-29 DIAGNOSIS — M542 Cervicalgia: Secondary | ICD-10-CM | POA: Diagnosis not present

## 2015-07-29 DIAGNOSIS — M549 Dorsalgia, unspecified: Secondary | ICD-10-CM

## 2015-07-29 DIAGNOSIS — M546 Pain in thoracic spine: Secondary | ICD-10-CM

## 2015-07-29 DIAGNOSIS — N529 Male erectile dysfunction, unspecified: Secondary | ICD-10-CM

## 2015-07-29 DIAGNOSIS — J069 Acute upper respiratory infection, unspecified: Secondary | ICD-10-CM

## 2015-07-29 MED ORDER — MELOXICAM 7.5 MG PO TABS: 7.5000 mg | ORAL_TABLET | Freq: Every day | ORAL | Status: DC

## 2015-07-29 MED ORDER — CYCLOBENZAPRINE HCL 5 MG PO TABS: ORAL_TABLET | ORAL | Status: DC

## 2015-07-29 MED ORDER — SILDENAFIL CITRATE 20 MG PO TABS: ORAL_TABLET | ORAL | Status: DC

## 2015-07-29 NOTE — Patient Instructions (Addendum)
     IF you received an x-ray today, you will [facility] an [facility]. Please contact [facility] at [phone] with questions or concerns regarding your invoice.   IF you received labwork today, you will receive an invoice from [facility]. Please contact [facility] at [phone] with questions or concerns regarding your invoice.   Our billing staff will not be able to assist you with questions regarding bills from these companies.  You will be contacted with the lab results as soon as they are available. The fastest way to get your results is to activate your My Chart account. Instructions are located on the last page of this paperwork. If you have not heard from us regarding the results in 2 weeks, please contact this office.    Saline nasal spray atleast 4 times per day, over the counter mucinex or mucinex DM, drink plenty of fluids.  Return to clinic if worsening.  Try starting back on meloxicam, flexeril if needed, and I will refer you to physical therapy.   Return to the clinic or go to the nearest emergency room if any of your symptoms worsen or new symptoms occur.

## 2015-07-29 NOTE — Progress Notes (Signed)
By signing my name below, I, Mesha Guinyard, attest that this documentation has been prepared under the direction and in the presence of Trinna Post, MD.  Electronically Signed: Arvilla Market, Medical Scribe. 07/29/2015. 4:23 PM.  Subjective:    Patient ID: Michael Castaneda, male    DOB: January 27, 1981, 35 y.o.   MRN: [id number]  HPI Chief Complaint  Patient presents with  . Follow-up  . upper back pain on the right side    "better from not lifting" hurts at work, but not outside of work, today have not hurt at all.(off of work today)    HPI Comments: Michael Castaneda is a 35 y.o. male who presents to the [facility] for a follow-up from upper back pain located in his right trapezius muscle. See previous visits. He was treated with mobic and flexeril. Massage and ROM with plan to refer to ortho or PT if not improving. He hasn't been lifting heavy weights in the gym. He typically notices back pain when he lifts anything heavier than 40-50 lbs. He went to chiropractor, and felt relief when he got a massage but the back pain soon returned. He was taking mobic was one pill a day in the morning, and felt relief. He says it no longer works for him. He also used ibuprofen 2-3 times a week over a course of the week for the pain, and says it "took the edge off". Neg c-spine x-ray Jan 30th.  He says he smokes. He mentions getting over a cold that began a few days ago. He reports associated symptoms of sore throat, rhinorrhea, and congestion.     Erectile dysfunction: Nl testosterone level in Feb. Treated with sildenafil 1-2 tablets as needed. He still takes medication. Pt denies vision changes, hearing, chest pain, headache or dizziness with medication.  There are no active problems to display for this patient.  No past medical history on file. Past Surgical History  Procedure Laterality Date  . Appendectomy     No Known Allergies Prior to Admission medications   Medication Sig Start  Date End Date Taking? Authorizing Provider  sildenafil (REVATIO) 20 MG tablet 1-2 tablets by mouth prior to onset of sexual activity. 05/27/15  Yes Shade Flood, MD  cyclobenzaprine (FLEXERIL) 5 MG tablet 1 pill by mouth up to every 8 hours as needed. Start with one pill by mouth each bedtime as needed due to sedation Patient not taking: Reported on 07/29/2015 05/11/15   Shade Flood, MD  meloxicam (MOBIC) 7.5 MG tablet Take 1-2 tablets (7.5-15 mg total) by mouth daily. Patient not taking: Reported on 07/29/2015 05/27/15   Shade Flood, MD   Social History   Social History  . Marital Status: Single    Spouse Name: N/A  . Number of Children: N/A  . Years of Education: N/A   Occupational History  . Not on file.   Social History Main Topics  . Smoking status: Current Every Day Smoker  . Smokeless tobacco: Not on file  . Alcohol Use: No  . Drug Use: No  . Sexual Activity: Not on file   Other Topics Concern  . Not on file   Social History Narrative   Review of Systems  Constitutional: Negative for fever, fatigue and unexpected weight change.  HENT: Positive for congestion, rhinorrhea and sore throat.   Eyes: Negative for visual disturbance.  Respiratory: Positive for cough. Negative for chest tightness and shortness of breath.   Cardiovascular: Negative for  chest pain, palpitations and leg swelling.  Gastrointestinal: Negative for abdominal pain and blood in stool.  Musculoskeletal: Positive for back pain.  Neurological: Negative for dizziness, light-headedness and headaches.   Objective:   Physical Exam  Constitutional: He is oriented to person, place, and time. He appears well-developed and well-nourished.  HENT:  Head: Normocephalic and atraumatic.  Right Ear: Tympanic membrane, external ear and ear canal normal.  Left Ear: Tympanic membrane, external ear and ear canal normal.  Nose: No rhinorrhea.  Mouth/Throat: Oropharynx is clear and moist and mucous membranes  are normal. No oropharyngeal exudate or posterior oropharyngeal erythema.  Eyes: Conjunctivae are normal. Pupils are equal, round, and reactive to light.  Neck: Neck supple.  Cardiovascular: Normal rate, regular rhythm, normal heart sounds and intact distal pulses.   No murmur heard. Pulmonary/Chest: Effort normal and breath sounds normal. He has no wheezes. He has no rhonchi. He has no rales.  Abdominal: Soft. There is no tenderness.  Musculoskeletal:  Left and right shoulder FROM C-Spine FROM Full RTC strength equal scapulothoracic motion No apparent atrophy of the muscles of the back or neck  Lymphadenopathy:    He has no cervical adenopathy.  Neurological: He is alert and oriented to person, place, and time.  Skin: Skin is warm and dry. No rash noted.  Psychiatric: He has a normal mood and affect. His behavior is normal.  Vitals reviewed.   Assessment & Plan:   Michael Castaneda is a 34 y.o. male Erectile dysfunction, unspecified erectile dysfunction type - Plan: sildenafil (REVATIO) 20 MG tablet  -Sildenafil Rx given - use lowest effective dose. Side effects discussed (including but not limited to headache/flushing, blue discoloration of vision, possible vascular steal and risk of cardiac effects if underlying unknown coronary artery disease, and permanent sensorineural hearing loss). Understanding expressed.  Upper back pain on right side - Plan: meloxicam (MOBIC) 7.5 MG tablet, cyclobenzaprine (FLEXERIL) 5 MG tablet, Ambulatory referral to Physical Therapy Neck pain on right side - Plan: meloxicam (MOBIC) 7.5 MG tablet, cyclobenzaprine (FLEXERIL) 5 MG tablet, Ambulatory referral to Physical Therapy  - Overall reassuring exam. May be an overuse type injury versus referred pain from his neck. As he had some relief previously, will restart meloxicam and Flexeril, but also refer to physical therapy. If no relief with physical therapy, plan to refer to orthopedics for further evaluation and  decision on other imaging. Return to clinic sooner if worse.  Acute upper respiratory infection  -Afebrile, reassuring exam. Symptomatic care discussed. RTC precautions  Meds ordered this encounter  Medications  . sildenafil (REVATIO) 20 MG tablet    Sig: 1-2 tablets by mouth prior to onset of sexual activity.    Dispense:  20 tablet    Refill:  0  . meloxicam (MOBIC) 7.5 MG tablet    Sig: Take 1-2 tablets (7.5-15 mg total) by mouth daily.    Dispense:  45 tablet    Refill:  0  . cyclobenzaprine (FLEXERIL) 5 MG tablet    Sig: 1 pill by mouth up to every 8 hours as needed. Start with one pill by mouth each bedtime as needed due to sedation    Dispense:  30 tablet    Refill:  0   Patient Instructions       IF you received an x-ray today, you will receive an invoice from [facility] Radiology. Please contact [facility] Radiology at [phone] with questions or concerns regarding your invoice.   IF you received labwork today, you will receive  an Economistinvoice from [facility]. Please contact [facility] at [phone] with questions or concerns regarding your invoice.   Our billing staff will not be able to assist you with questions regarding bills from these companies.  You will be contacted with the lab results as soon as they are available. The fastest way to get your results is to activate your My Chart account. Instructions are located on the last page of this paperwork. If you have not heard from us regarding the results in 2 weeks, please contact this office.    Saline nasal spray atleast 4 times per day, over the counter mucinex or mucinex DM, drink plenty of fluids.  Return to clinic if worsening.  Try starting back on meloxicam, flexeril if needed, and I will refer you to physical therapy.   Return to the clinic or go to the nearest emergency room if any of your symptoms worsen or new symptoms occur.     I personally performed the services  described in this documentation, which was scribed in my presence. The recorded information has been reviewed and considered, and addended by me as needed.

## 2015-11-28 ENCOUNTER — Emergency Department (HOSPITAL_COMMUNITY)
Admission: EM | Admit: 2015-11-28 | Discharge: 2015-11-28 | Disposition: A | Discharge status: Home / Self Care | Payer: Managed Care, Other (non HMO) | Attending: Emergency Medicine | Admitting: Emergency Medicine

## 2015-11-28 ENCOUNTER — Emergency Department (HOSPITAL_COMMUNITY): Payer: Managed Care, Other (non HMO)

## 2015-11-28 ENCOUNTER — Encounter (HOSPITAL_COMMUNITY): Payer: Self-pay | Admitting: Emergency Medicine

## 2015-11-28 DIAGNOSIS — R0789 Other chest pain: Secondary | ICD-10-CM | POA: Diagnosis not present

## 2015-11-28 DIAGNOSIS — F17200 Nicotine dependence, unspecified, uncomplicated: Secondary | ICD-10-CM | POA: Diagnosis not present

## 2015-11-28 DIAGNOSIS — R110 Nausea: Secondary | ICD-10-CM | POA: Insufficient documentation

## 2015-11-28 DIAGNOSIS — M549 Dorsalgia, unspecified: Secondary | ICD-10-CM | POA: Insufficient documentation

## 2015-11-28 DIAGNOSIS — R0602 Shortness of breath: Secondary | ICD-10-CM | POA: Diagnosis not present

## 2015-11-28 DIAGNOSIS — R6883 Chills (without fever): Secondary | ICD-10-CM | POA: Insufficient documentation

## 2015-11-28 DIAGNOSIS — Z791 Long term (current) use of non-steroidal anti-inflammatories (NSAID): Secondary | ICD-10-CM | POA: Diagnosis not present

## 2015-11-28 DIAGNOSIS — R1011 Right upper quadrant pain: Secondary | ICD-10-CM

## 2015-11-28 DIAGNOSIS — Z79899 Other long term (current) drug therapy: Secondary | ICD-10-CM | POA: Insufficient documentation

## 2015-11-28 LAB — COMPREHENSIVE METABOLIC PANEL
ALT: 25 U/L (ref 17–63)
AST: 25 U/L (ref 15–41)
Albumin: 4.1 g/dL (ref 3.5–5.0)
Alkaline Phosphatase: 50 U/L (ref 38–126)
Anion gap: 7 (ref 5–15)
BILIRUBIN TOTAL: 0.5 mg/dL (ref 0.3–1.2)
BUN: 15 mg/dL (ref 6–20)
CO2: 25 mmol/L (ref 22–32)
CREATININE: 1.21 mg/dL (ref 0.61–1.24)
Calcium: 9.3 mg/dL (ref 8.9–10.3)
Chloride: 106 mmol/L (ref 101–111)
Glucose, Bld: 91 mg/dL (ref 65–99)
Potassium: 3.6 mmol/L (ref 3.5–5.1)
Sodium: 138 mmol/L (ref 135–145)
TOTAL PROTEIN: 7.2 g/dL (ref 6.5–8.1)

## 2015-11-28 LAB — CBC WITH DIFFERENTIAL/PLATELET
BASOS ABS: 0 10*3/uL (ref 0.0–0.1)
BASOS PCT: 0 %
EOS ABS: 0.1 10*3/uL (ref 0.0–0.7)
EOS PCT: 1 %
HCT: 39.4 % (ref 39.0–52.0)
HEMOGLOBIN: 13.9 g/dL (ref 13.0–17.0)
Lymphocytes Relative: 8 %
Lymphs Abs: 0.6 10*3/uL — ABNORMAL LOW (ref 0.7–4.0)
MCH: 30.3 pg (ref 26.0–34.0)
MCHC: 35.3 g/dL (ref 30.0–36.0)
MCV: 86 fL (ref 78.0–100.0)
Monocytes Absolute: 0.8 10*3/uL (ref 0.1–1.0)
Monocytes Relative: 9 %
NEUTROS PCT: 82 %
Neutro Abs: 6.6 10*3/uL (ref 1.7–7.7)
PLATELETS: 211 10*3/uL (ref 150–400)
RBC: 4.58 MIL/uL (ref 4.22–5.81)
RDW: 12.2 % (ref 11.5–15.5)
WBC: 8.1 10*3/uL (ref 4.0–10.5)

## 2015-11-28 LAB — URINALYSIS, ROUTINE W REFLEX MICROSCOPIC
Bilirubin Urine: NEGATIVE
GLUCOSE, UA: NEGATIVE mg/dL
Hgb urine dipstick: NEGATIVE
Ketones, ur: NEGATIVE mg/dL
LEUKOCYTES UA: NEGATIVE
Nitrite: NEGATIVE
PH: 6 (ref 5.0–8.0)
Protein, ur: NEGATIVE mg/dL
Specific Gravity, Urine: 1.01 (ref 1.005–1.030)

## 2015-11-28 LAB — LIPASE, BLOOD: Lipase: 31 U/L (ref 11–51)

## 2015-11-28 MED ORDER — TRAMADOL HCL 50 MG PO TABS
50.0000 mg | ORAL_TABLET | Freq: Once | ORAL | Status: AC
  Administered 2015-11-28: 50 mg via ORAL
  Filled 2015-11-28: qty 1

## 2015-11-28 MED ORDER — METHOCARBAMOL 500 MG PO TABS: 500.0000 mg | ORAL_TABLET | Freq: Every evening | ORAL | 0 refills | Status: DC | PRN

## 2015-11-28 MED ORDER — IBUPROFEN 200 MG PO TABS
600.0000 mg | ORAL_TABLET | Freq: Once | ORAL | Status: AC
  Administered 2015-11-28: 600 mg via ORAL
  Filled 2015-11-28: qty 3

## 2015-11-28 NOTE — ED Triage Notes (Signed)
Pt was seen yesterday at urgent care yesterday for RUQ pain up under his ribs. Pt was given anti inflammatory and sts it hasn't relieved the pain. Pt c/o intermittent nausea. Pain worsens with movement and deep breathing. Denies injury. Denies SOB. Pt denies urinary symptoms but sts that pain radiates back towards R flank. Denies vomiting, diarrhea. A&Ox4 and ambulatory.

## 2015-11-28 NOTE — ED Provider Notes (Signed)
[facility]-EMERGENCY DEPT Provider Note   CSN: [id number] Arrival date & time: 11/28/15  1208     History   Chief Complaint Chief Complaint  Patient presents with  . Abdominal Pain    HPI Michael Castaneda is a 35 y.o. male who presents with right lower inspiratory chest pain. PMH significant for current tobacco use and chronic back pain. Past surgical hx significant for appendectomy. He states that over the past 2 days he has had pain over the right lower chest wall and RUQ. Feels dull, achy. Radiates to the right mid-back. No association with food. Reports associated chills, inspiratory chest pain, and nausea. He went to UC yesterday and they prescribed him diclofenac which has been taking with no relief. Denies fever, SOB, cough, wheezing, central chest pain, vomiting, diarrhea/constipation, irritative voiding symptoms, hematuria. Denies leg swelling, hemoptysis, recent surgery/trauma, hormone use, travel, hx of DVT/PE.   HPI  History reviewed. No pertinent past medical history.  There are no active problems to display for this patient.   Past Surgical History:  Procedure Laterality Date  . APPENDECTOMY         Home Medications    Prior to Admission medications   Medication Sig Start Date End Date Taking? Authorizing Provider  cyclobenzaprine (FLEXERIL) 5 MG tablet 1 pill by mouth up to every 8 hours as needed. Start with one pill by mouth each bedtime as needed due to sedation 07/29/15   Shade Flood, MD  diclofenac (VOLTAREN) 75 MG EC tablet  11/27/15   Historical Provider, MD  meloxicam (MOBIC) 7.5 MG tablet Take 1-2 tablets (7.5-15 mg total) by mouth daily. 07/29/15   Shade Flood, MD  sildenafil (REVATIO) 20 MG tablet 1-2 tablets by mouth prior to onset of sexual activity. 07/29/15   Shade Flood, MD    Family History Family History  Problem Relation Age of Onset  . Heart disease Father     Social History Social History  Substance Use Topics  . Smoking status:  Current Every Day Smoker  . Smokeless tobacco: Current User  . Alcohol use No     Allergies   Review of patient's allergies indicates no known allergies.   Review of Systems Review of Systems  Constitutional: Positive for chills. Negative for fever.  Respiratory: Positive for shortness of breath. Negative for cough, chest tightness and wheezing.   Cardiovascular: Positive for chest pain. Negative for palpitations and leg swelling.  Gastrointestinal: Positive for abdominal pain and nausea. Negative for constipation, diarrhea and vomiting.  Genitourinary: Negative for dysuria, flank pain and hematuria.  Musculoskeletal: Positive for back pain.  Skin: Negative for rash.  All other systems reviewed and are negative.    Physical Exam Updated Vital Signs BP 137/72 (BP Location: Right Arm)   Pulse 97   Temp 98.2 F (36.8 C) (Oral)   Resp 18   Ht 5\' 11"  (1.803 m)   Wt 79.4 kg   SpO2 100%   BMI 24.41 kg/m   Physical Exam  Constitutional: He is oriented to person, place, and time. He appears well-developed and well-nourished. No distress.  HENT:  Head: Normocephalic and atraumatic.  Eyes: Conjunctivae are normal. Pupils are equal, round, and reactive to light. Right eye exhibits no discharge. Left eye exhibits no discharge. No scleral icterus.  Neck: Normal range of motion. Neck supple.  Cardiovascular: Normal rate and regular rhythm.  Exam reveals no gallop and no friction rub.   No murmur heard. Pulmonary/Chest: Effort normal and breath sounds  normal. No respiratory distress. He has no wheezes. He has no rales. He exhibits tenderness.  Pain is reproduced with breathing; mild reproducible tenderness over right lower ribs  Abdominal: Soft. Bowel sounds are normal. He exhibits no distension and no mass. There is no tenderness. There is no rebound and no guarding. No hernia.  Musculoskeletal: He exhibits no edema.  Neurological: He is alert and oriented to person, place, and time.   Skin: Skin is warm and dry.  Psychiatric: He has a normal mood and affect.  Nursing note and vitals reviewed.    ED Treatments / Results  Labs (all labs ordered are listed, but only abnormal results are displayed) Labs Reviewed  CBC WITH DIFFERENTIAL/PLATELET - Abnormal; Notable for the following:       Result Value   Lymphs Abs 0.6 (*)    All other components within normal limits  COMPREHENSIVE METABOLIC PANEL  LIPASE, BLOOD  URINALYSIS, ROUTINE W REFLEX MICROSCOPIC (NOT AT [facility])    EKG  EKG Interpretation None       Radiology Dg Chest 2 View  Result Date: 11/28/2015 CLINICAL DATA:  RLQ chest pain onset 1.5 days ago, worsening with movement and deep inspiration. Also states onset of nausea today. Denies any known trauma. H/o Pneumonia. Smoker. EXAM: CHEST  2 VIEW COMPARISON:  None. FINDINGS: The heart size and mediastinal contours are within normal limits. Both lungs are clear. No pleural effusion or pneumothorax. The visualized skeletal structures are unremarkable. IMPRESSION: Normal chest radiographs. Electronically Signed   By: Amie Portlandavid  Ormond M.D.   On: 11/28/2015 13:45   [place] Abdomen Limited Ruq  Result Date: 11/28/2015 CLINICAL DATA:  Right upper quadrant pain EXAM: US ABDOMEN LIMITED - RIGHT UPPER QUADRANT COMPARISON:  None. FINDINGS: Gallbladder: No gallstones or wall thickening visualized. No sonographic Murphy sign noted by sonographer. Common bile duct: Diameter: 4 mm Liver: The liver is subjectively prominent was not measured. No focal masses. IMPRESSION: No acute abnormalities.  No evidence of cholecystitis on this study. Electronically Signed   By: Gerome Samavid  Williams III M.D   On: 11/28/2015 14:33    Procedures Procedures (including critical care time)  Medications Ordered in ED Medications  ibuprofen (ADVIL,MOTRIN) tablet 600 mg (600 mg Oral Given 11/28/15 1421)  traMADol (ULTRAM) tablet 50 mg (50 mg Oral Given 11/28/15 1421)     Initial Impression / Assessment  and Plan / ED Course  I have reviewed the triage vital signs and the nursing notes.  Pertinent labs & imaging results that were available during my care of the patient were reviewed by me and considered in my medical decision making (see chart for details).  Clinical Course   35 year old male who presents with right sided lower chest/RUQ pain. Most likely MSK pain. Patient is afebrile, not tachycardic or tachypneic, normotensive, and not hypoxic. PERC neg. CXR is normal. RUQ US is normal. CBC and CMP are unremarkable. Lipase is normal. UA clean. Pt given Ibuprofen and Tramadol and on recheck patient states he is almost pain free. Advised continue diclofenac, will rx muscle relaxer, and heat/ice prn. Patient is NAD, non-toxic, with stable VS. Patient is informed of clinical course, understands medical decision making process, and agrees with plan. Opportunity for questions provided and all questions answered. Return precautions given.   Final Clinical Impressions(s) / ED Diagnoses   Final diagnoses:  RUQ pain  Right-sided chest wall pain    New Prescriptions New Prescriptions   METHOCARBAMOL (ROBAXIN) 500 MG TABLET  Take 1 tablet (500 mg total) by mouth at bedtime and may repeat dose one time if needed.     Bethel BornKelly Marie Abdallah Hern, PA-C 11/28/15 1451    Glynn OctaveStephen Rancour, MD 11/28/15 1540

## 2015-12-09 ENCOUNTER — Ambulatory Visit (INDEPENDENT_AMBULATORY_CARE_PROVIDER_SITE_OTHER): Payer: Managed Care, Other (non HMO) | Admitting: Family Medicine

## 2015-12-09 ENCOUNTER — Encounter: Payer: Self-pay | Admitting: Family Medicine

## 2015-12-09 VITALS — BP 130/80 | HR 78 | Temp 97.7°F | Resp 18 | Ht 71.0 in | Wt 177.1 lb

## 2015-12-09 DIAGNOSIS — Z720 Tobacco use: Secondary | ICD-10-CM

## 2015-12-09 DIAGNOSIS — F17200 Nicotine dependence, unspecified, uncomplicated: Secondary | ICD-10-CM | POA: Insufficient documentation

## 2015-12-09 DIAGNOSIS — Z23 Encounter for immunization: Secondary | ICD-10-CM | POA: Diagnosis not present

## 2015-12-09 DIAGNOSIS — M6248 Contracture of muscle, other site: Secondary | ICD-10-CM

## 2015-12-09 DIAGNOSIS — M62838 Other muscle spasm: Secondary | ICD-10-CM | POA: Insufficient documentation

## 2015-12-09 MED ORDER — VARENICLINE TARTRATE 0.5 MG X 11 & 1 MG X 42 PO MISC: ORAL | 0 refills | Status: DC

## 2015-12-09 MED ORDER — TRAMADOL HCL 50 MG PO TABS: 50.0000 mg | ORAL_TABLET | Freq: Three times a day (TID) | ORAL | 0 refills | Status: DC | PRN

## 2015-12-09 NOTE — Progress Notes (Signed)
Michael Castaneda is a 35 y.o. male who presents to [name] Medicine today for establish care and discuss right trapezius pain and smoking cessation.  Right trapezius pain: Patient has ongoing pain in the right trapezius for the last several years. He suffered an injury while doing a heavy dead left and has had pain since. Pain is worse with activity and better with rest. He's tried NSAIDs and muscle relaxers that have not helped much. He's been advised to have physical therapy but was reluctant due to cost. The pain can be quite severe at times. He denies any radiating pain weakness or numbness fevers or chills.  Smoking: Patient is a current daily smoker using about a pack and a half of cigarettes a day. He smokes importance. He's had trouble quitting in the past. He is not confident in his ability to quit but is very desirous of smoking cessation. Nobody else in the household smokes.  Recent abdominal pain. Patient was seen in the emergency department on August 19 for right upper quadrant abdominal pain. Workup was unremarkable and patient has been pain free since. He feels well otherwise no fevers chills nausea vomiting or diarrhea.   No past medical history on file. Past Surgical History:  Procedure Laterality Date  . APPENDECTOMY     Social History  Substance Use Topics  . Smoking status: Current Every Day Smoker  . Smokeless tobacco: Current User  . Alcohol use No   family history includes Heart disease in his father.  ROS as above: No headache, visual changes, nausea, vomiting, diarrhea, constipation, dizziness, abdominal pain, skin rash, fevers, chills, night sweats, weight loss, swollen lymph nodes,  joint swelling, muscle aches, chest pain, shortness of breath, mood changes, visual or auditory hallucinations.    Medications: Current Outpatient Prescriptions    Medication Sig Dispense Refill  . sildenafil (REVATIO) 20 MG tablet 1-2 tablets by mouth prior to onset of sexual activity. 20 tablet 0  . traMADol (ULTRAM) 50 MG tablet Take 1 tablet (50 mg total) by mouth every 8 (eight) hours as needed. 30 tablet 0  . varenicline (CHANTIX STARTING MONTH PAK) 0.5 MG X 11 & 1 MG X 42 tablet Take one 0.5mg  tablet by mouth once daily for 3 days, then increase to one 0.5mg  tablet twice daily for 3 days, then increase to one 1mg  tablet twice daily. 53 tablet 0   No current facility-administered medications for this visit.    No Known Allergies   Exam:  BP 130/80 (BP Location: Right Arm, Patient Position: Sitting, Cuff Size: Normal)   Pulse 78   Temp 97.7 F (36.5 C) (Oral)   Resp 18   Ht 5\' 11"  (1.803 m)   Wt 177 lb 1.9 oz (80.3 kg)   SpO2 99%   BMI 24.70 kg/m  Gen: Well NAD HEENT: EOMI,  MMM Lungs: Normal work of breathing. CTABL Heart: RRR no MRG Abd: NABS, Soft. Nondistended, Nontender Exts: Brisk capillary refill, warm and well perfused.  MSK: Tender to palpation right trapezius. Normal scapular motion. Pulses capillary refill and sensation intact bilateral upper extremities. Normal neck motion. Upper extremity strength is equal and normal throughout.    Chemistry      Component Value Date/Time   NA 138 11/28/2015 1314   K 3.6 11/28/2015 1314   CL 106 11/28/2015 1314   CO2 25 11/28/2015 1314   BUN 15 11/28/2015 1314   CREATININE 1.21 11/28/2015 1314  Component Value Date/Time   CALCIUM 9.3 11/28/2015 1314   ALKPHOS 50 11/28/2015 1314   AST 25 11/28/2015 1314   ALT 25 11/28/2015 1314   BILITOT 0.5 11/28/2015 1314     Lab Results  Component Value Date   WBC 8.1 11/28/2015   HGB 13.9 11/28/2015   HCT 39.4 11/28/2015   MCV 86.0 11/28/2015   PLT 211 11/28/2015   Lab Results  Component Value Date   LIPASE 31 11/28/2015     Cervical spine dated 05/11/2015 reviewed CLINICAL DATA:  Neck pain  EXAM: CERVICAL SPINE -  2-3 VIEW  COMPARISON:  None.  FINDINGS: There is no evidence of cervical spine fracture or prevertebral soft tissue swelling. Alignment is normal. No other significant bone abnormalities are identified.  IMPRESSION: Negative cervical spine radiographs.   Electronically Signed   By: Marlan Palauharles  Clark M.D.   On: 05/11/2015 16:18    Assessment and Plan: 35 y.o. male with  1) right trapezius pain: Likely spasm and dysfunction. Plan to treat with at least one physical therapy visit from teaching along with TENS unit and heating pad. Sparing use of tramadol. Warned patient against the possibility of dependency with this medication. I checked [place] controlled substance Registry patient has no controlled substance usage in the last year and a half. Recheck in a month or 2.  2) smoking cessation: Discussed options. Plan for Chantix. Recheck in 1-2 months.  3) Tdap given prior to discharge. Patient declined influenza vaccine.   Orders Placed This Encounter  Procedures  . Tdap vaccine greater than or equal to 7yo IM  . Ambulatory referral to Physical Therapy    Referral Priority:   Routine    Referral Type:   Physical Medicine    Referral Reason:   Specialty Services Required    Requested Specialty:   Physical Therapy    Number of Visits Requested:   1    Discussed warning signs or symptoms. Please see discharge instructions. Patient expresses understanding.

## 2015-12-09 NOTE — Patient Instructions (Signed)
Thank you for coming in today. Go to PT at least once.  Return in 1-2 months.  Start chantix.   TENS UNIT: This is helpful for muscle pain and spasm.   Search and Purchase a TENS 7000 2nd edition at www.tenspros.com. It should be less than $30.     TENS unit instructions: Do not shower or bathe with the unit on Turn the unit off before removing electrodes or batteries If the electrodes lose stickiness add a drop of water to the electrodes after they are disconnected from the unit and place on plastic sheet. If you continued to have difficulty, call the TENS unit company to purchase more electrodes. Do not apply lotion on the skin area prior to use. Make sure the skin is clean and dry as this will help prolong the life of the electrodes. After use, always check skin for unusual red areas, rash or other skin difficulties. If there are any skin problems, does not apply electrodes to the same area. Never remove the electrodes from the unit by pulling the wires. Do not use the TENS unit or electrodes other than as directed. Do not change electrode placement without consultating your therapist or physician. Keep 2 fingers with between each electrode. Wear time ratio is 2:1, on to off times.    For example on for 30 minutes off for 15 minutes and then on for 30 minutes off for 15 minutes  Varenicline oral tablets What is this medicine? VARENICLINE (var EN i kleen) is used to help people quit smoking. It can reduce the symptoms caused by stopping smoking. It is used with a patient support program recommended by your physician. This medicine may be used for other purposes; ask your health care provider or pharmacist if you have questions. What should I tell my health care provider before I take this medicine? They need to know if you have any of these conditions: -bipolar disorder, depression, schizophrenia or other mental illness -heart disease -if you often drink alcohol -kidney  disease -peripheral vascular disease -seizures -stroke -suicidal thoughts, plans, or attempt; a previous suicide attempt by you or a family member -an unusual or allergic reaction to varenicline, other medicines, foods, dyes, or preservatives -pregnant or trying to get pregnant -breast-feeding How should I use this medicine? Take this medicine by mouth after eating. Take with a full glass of water. Follow the directions on the prescription label. Take your doses at regular intervals. Do not take your medicine more often than directed. There are 3 ways you can use this medicine to help you quit smoking; talk to your health care professional to decide which plan is right for you: 1) you can choose a quit date and start this medicine 1 week before the quit date, or, 2) you can start taking this medicine before you choose a quit date, and then pick a quit date between day 8 and 35 days of treatment, or, 3) if you are not sure that you are able or willing to quit smoking right away, start taking this medicine and slowly decrease the amount you smoke as directed by your health care professional with the goal of being cigarette-free by week 12 of treatment. Stick to your plan; ask about support groups or other ways to help you remain cigarette-free. If you are motivated to quit smoking and did not succeed during a previous attempt with this medicine for reasons other than side effects, or if you returned to smoking after this treatment, speak with  your health care professional about whether another course of this medicine may be right for you. A special MedGuide will be given to you by the pharmacist with each prescription and refill. Be sure to read this information carefully each time. Talk to your pediatrician regarding the use of this medicine in children. This medicine is not approved for use in children. Overdosage: If you think you have taken too much of this medicine contact a poison control center  or emergency room at once. NOTE: This medicine is only for you. Do not share this medicine with others. What if I miss a dose? If you miss a dose, take it as soon as you can. If it is almost time for your next dose, take only that dose. Do not take double or extra doses. What may interact with this medicine? -alcohol or any product that contains alcohol -insulin -other stop smoking aids -theophylline -warfarin This list may not describe all possible interactions. Give your health care provider a list of all the medicines, herbs, non-prescription drugs, or dietary supplements you use. Also tell them if you smoke, drink alcohol, or use illegal drugs. Some items may interact with your medicine. What should I watch for while using this medicine? Visit your doctor or health care professional for regular check ups. Ask for ongoing advice and encouragement from your doctor or healthcare professional, friends, and family to help you quit. If you smoke while on this medication, quit again Your mouth may get dry. Chewing sugarless gum or sucking hard candy, and drinking plenty of water may help. Contact your doctor if the problem does not go away or is severe. You may get drowsy or dizzy. Do not drive, use machinery, or do anything that needs mental alertness until you know how this medicine affects you. Do not stand or sit up quickly, especially if you are an older patient. This reduces the risk of dizzy or fainting spells. Sleepwalking can happen during treatment with this medicine, and can sometimes lead to behavior that is harmful to you, other people, or property. Stop taking this medicine and tell your doctor if you start sleepwalking or have other unusual sleep-related activity. Decrease the amount of alcoholic beverages that you drink during treatment with this medicine until you know if this medicine affects your ability to tolerate alcohol. Some people have experienced increased drunkenness  (intoxication), unusual or sometimes aggressive behavior, or no memory of things that have happened (amnesia) during treatment with this medicine. The use of this medicine may increase the chance of suicidal thoughts or actions. Pay special attention to how you are responding while on this medicine. Any worsening of mood, or thoughts of suicide or dying should be reported to your health care professional right away. What side effects may I notice from receiving this medicine? Side effects that you should report to your doctor or health care professional as soon as possible: -allergic reactions like skin rash, itching or hives, swelling of the face, lips, tongue, or throat -acting aggressive, being angry or violent, or acting on dangerous impulses -breathing problems -changes in vision -chest pain or chest tightness -confusion, trouble speaking or understanding -new or worsening depression, anxiety, or panic attacks -extreme increase in activity and talking (mania) -fast, irregular heartbeat -feeling faint or lightheaded, falls -fever -pain in legs when walking -problems with balance, talking, walking -redness, blistering, peeling or loosening of the skin, including inside the mouth -ringing in ears -seeing or hearing things that aren't there (hallucinations) -seizures -sleepwalking -  sudden numbness or weakness of the face, arm or leg -thoughts about suicide or dying, or attempts to commit suicide -trouble passing urine or change in the amount of urine -unusual bleeding or bruising -unusually weak or tired Side effects that usually do not require medical attention (report to your doctor or health care professional if they continue or are bothersome): -constipation -headache -nausea, vomiting -strange dreams -stomach gas -trouble sleeping This list may not describe all possible side effects. Call your doctor for medical advice about side effects. You may report side effects to FDA at  1-800-FDA-1088. Where should I keep my medicine? Keep out of the reach of children. Store at room temperature between 15 and 30 degrees C (59 and 86 degrees F). Throw away any unused medicine after the expiration date. NOTE: This sheet is a summary. It may not cover all possible information. If you have questions about this medicine, talk to your doctor, pharmacist, or health care provider.    2016, Elsevier/Gold Standard. (2014-12-11 16:14:23)

## 2015-12-09 NOTE — Progress Notes (Signed)
Pt here to establish care.  Wants to stop smoking, and also has back issues.

## 2016-01-05 ENCOUNTER — Ambulatory Visit (INDEPENDENT_AMBULATORY_CARE_PROVIDER_SITE_OTHER): Payer: Managed Care, Other (non HMO) | Admitting: Family Medicine

## 2016-01-05 ENCOUNTER — Encounter: Payer: Self-pay | Admitting: Family Medicine

## 2016-01-05 VITALS — BP 133/80 | HR 78 | Wt 172.0 lb

## 2016-01-05 DIAGNOSIS — Z720 Tobacco use: Secondary | ICD-10-CM

## 2016-01-05 DIAGNOSIS — M6248 Contracture of muscle, other site: Secondary | ICD-10-CM | POA: Diagnosis not present

## 2016-01-05 DIAGNOSIS — M545 Low back pain, unspecified: Secondary | ICD-10-CM | POA: Insufficient documentation

## 2016-01-05 DIAGNOSIS — M62838 Other muscle spasm: Secondary | ICD-10-CM

## 2016-01-05 DIAGNOSIS — F17200 Nicotine dependence, unspecified, uncomplicated: Secondary | ICD-10-CM

## 2016-01-05 DIAGNOSIS — G8929 Other chronic pain: Secondary | ICD-10-CM | POA: Diagnosis not present

## 2016-01-05 MED ORDER — VARENICLINE TARTRATE 1 MG PO TABS: 1.0000 mg | ORAL_TABLET | Freq: Two times a day (BID) | ORAL | 3 refills | Status: DC

## 2016-01-05 MED ORDER — TRAMADOL HCL 50 MG PO TABS: 50.0000 mg | ORAL_TABLET | Freq: Three times a day (TID) | ORAL | 5 refills | Status: DC | PRN

## 2016-01-05 NOTE — Progress Notes (Signed)
       Michael Castaneda is a 35 y.o. male who [name] today for follow smoking in right trapezius pain.  Right trapezius pain: Patient is doing well in the last month with home exercise program TENS unit and intermittent tramadol. He notes pain is manageable.  Smoking: Patient has successfully quit smoking using Chantix. He is less cigarettes is a few days ago. He notes nausea initially with Chantix but she has improved since. He notes Chantix help with cravings.  Low back pain: Patient notes mild intermittent low back pain. This is currently not present but has been present for years. Pain seems to be worse following heavy activity. No radiating pain weakness or numbness.   No past medical history on file. Past Surgical History:  Procedure Laterality Date  . APPENDECTOMY     Social History  Substance Use Topics  . Smoking status: Current Every Day Smoker  . Smokeless tobacco: Current User  . Alcohol use No   family history includes Heart disease in his father.  ROS as above:  Medications: Current Outpatient Prescriptions  Medication Sig Dispense Refill  . sildenafil (REVATIO) 20 MG tablet 1-2 tablets by mouth prior to onset of sexual activity. 20 tablet 0  . traMADol (ULTRAM) 50 MG tablet Take 1 tablet (50 mg total) by mouth every 8 (eight) hours as needed. 30 tablet 5  . varenicline (CHANTIX) 1 MG tablet Take 1 tablet (1 mg total) by mouth 2 (two) times daily. 60 tablet 3   No current facility-administered medications for this visit.    No Known Allergies   Exam:  BP 133/80   Pulse 78   Wt 172 lb (78 kg)   BMI 23.99 kg/m  Gen: Well NAD Right trapezius is mildly tender L-spine nontender normal motion. Psych: Alert and oriented process and affect.  No results found for this or any previous visit (from the past 24 hour(s)). No results  found.    Assessment and Plan: 35 y.o. male with  Trapezius Spasm: Improved continue current treatment  Smoking: Doing well. Continue Chantix. Recheck in a few months.  Lumbago: Intermittent return as needed.   No orders of the defined types were placed in this encounter.   Discussed warning signs or symptoms. Please see discharge instructions. Patient expresses understanding.

## 2016-01-05 NOTE — Patient Instructions (Signed)
Thank you for coming in today. Continue chantix.  Return in 3-6 months or sooner if needed.

## 2016-01-31 ENCOUNTER — Encounter: Payer: Self-pay | Admitting: Family Medicine

## 2016-02-01 MED ORDER — VARENICLINE TARTRATE 1 MG PO TABS: 1.0000 mg | ORAL_TABLET | Freq: Two times a day (BID) | ORAL | 3 refills | Status: DC

## 2016-05-17 ENCOUNTER — Other Ambulatory Visit: Payer: Self-pay | Admitting: Family Medicine

## 2016-05-17 ENCOUNTER — Ambulatory Visit (INDEPENDENT_AMBULATORY_CARE_PROVIDER_SITE_OTHER): Payer: Managed Care, Other (non HMO)

## 2016-05-17 ENCOUNTER — Encounter: Payer: Self-pay | Admitting: Family Medicine

## 2016-05-17 ENCOUNTER — Ambulatory Visit (INDEPENDENT_AMBULATORY_CARE_PROVIDER_SITE_OTHER): Payer: Managed Care, Other (non HMO) | Admitting: Family Medicine

## 2016-05-17 VITALS — BP 133/84 | HR 74 | Wt 191.0 lb

## 2016-05-17 DIAGNOSIS — M5416 Radiculopathy, lumbar region: Secondary | ICD-10-CM | POA: Diagnosis not present

## 2016-05-17 DIAGNOSIS — M545 Low back pain, unspecified: Secondary | ICD-10-CM

## 2016-05-17 DIAGNOSIS — M899 Disorder of bone, unspecified: Secondary | ICD-10-CM | POA: Insufficient documentation

## 2016-05-17 DIAGNOSIS — M62838 Other muscle spasm: Secondary | ICD-10-CM | POA: Diagnosis not present

## 2016-05-17 DIAGNOSIS — G8929 Other chronic pain: Secondary | ICD-10-CM

## 2016-05-17 DIAGNOSIS — N529 Male erectile dysfunction, unspecified: Secondary | ICD-10-CM | POA: Diagnosis not present

## 2016-05-17 MED ORDER — TRAMADOL HCL 50 MG PO TABS: 50.0000 mg | ORAL_TABLET | Freq: Three times a day (TID) | ORAL | 5 refills | Status: DC | PRN

## 2016-05-17 MED ORDER — SILDENAFIL CITRATE 20 MG PO TABS: ORAL_TABLET | ORAL | 2 refills | Status: DC

## 2016-05-17 NOTE — Patient Instructions (Signed)
Thank you for coming in today. Recheck in about 3-6 months.  Use tramadol sparingly.  Consider dry needling.   Trap spasm and scapular dysfunction  Get x-ray lumbar spine today.

## 2016-05-17 NOTE — Progress Notes (Signed)
Michael Castaneda is a 36 y.o. male who presents to [facility] today for follow-up trapezius pain discuss new left knee pain and low back pain.  Right trapezius pain: Patient has been seen previously for pain in the right trapezius. He had a cervical spine x-ray about a year ago which was unremarkable. He is right-hand dominant and works in the Advertising copywriter at [employer]. Additionally he lifts weights recreationally. He notes pain present in the trapezius worse with activity better with rest. He denies radiating pain weakness or numbness. He takes tramadol  regularly for this pain and would like a refill of possible. He notes that he will have trouble affording physical therapy would like to try continued home therapy exercises.   Additionally patient notes new onset of left knee pain. He developed pain in the medial anterior knee after a workout in the gym where he did squats. He denies any swelling locking or catching. He notes the pain is almost completely improved now. He rates the pain as 1 out of 10. He denies fevers or chills and has not tried any medications for it yet.  Lastly he notes middle low back pain. Pain is worse with flexion activities. He occasionally notes pain radiating down the right leg to the lateral calf. This happens very infrequently and is not particularly bothersome. He denies weakness or numbness bowel bladder dysfunction. Symptoms are mild to moderate occur infrequently. Pain has been ongoing now for months to years.    History reviewed. No pertinent past medical history. Past Surgical History:  Procedure Laterality Date  . APPENDECTOMY     Social History  Substance Use Topics  . Smoking status: Former Games developer  . Smokeless tobacco: Former Neurosurgeon  . Alcohol use No     ROS:  As above   Medications: Current Outpatient Prescriptions  Medication Sig Dispense Refill  . sildenafil (REVATIO) 20 MG tablet 1-2 tablets by  mouth prior to onset of sexual activity. 20 tablet 2  . traMADol (ULTRAM) 50 MG tablet Take 1 tablet (50 mg total) by mouth every 8 (eight) hours as needed. 30 tablet 5   No current facility-administered medications for this visit.    No Known Allergies   Exam:  BP 133/84   Pulse 74   Wt 191 lb (86.6 kg)   BMI 26.64 kg/m  General: Well Developed, well nourished, and in no acute distress.  Neuro/Psych: Alert and oriented x3, extra-ocular muscles intact, able to move all 4 extremities, sensation grossly intact. Skin: Warm and dry, no rashes noted.  Respiratory: Not using accessory muscles, speaking in full sentences, trachea midline.  Cardiovascular: Pulses palpable, no extremity edema. Abdomen: Does not appear distended. MSK: C-spine: Nontender to spinal midline normal neck motion. Some pain with left lateral flexion located in the right trapezius. Right trapezius is tender to palpation. Patient has scapular protraction with shoulder motion. This does reproduce some of his pain. The thoracic spine is nontender. The lumbar spine is nontender to midline. Patient is tender to palpation at the very inferior perispinal muscle groups bilaterally. Normal lumbar motion. Lower extremity reflexes and strength are equal and normal throughout.  Left knee: Normal-appearing nontender normal motion without crepitation stable ligamentous exam. Negative anterior drawer testing valgus and varus stress test, and McMurray's testing.  No results found for this or any previous visit (from the past 48 hour(s)). Dg Lumbar Spine Complete  Result Date: 05/17/2016 CLINICAL DATA:  Chronic low back pain and right  radiculopathy. EXAM: LUMBAR SPINE - COMPLETE 4+ VIEW COMPARISON:  None. FINDINGS: There is no evidence of lumbar spine fracture. Alignment is normal. Intervertebral disc spaces are maintained. IMPRESSION: Negative. Electronically Signed   By: Francene BoyersJames  Maxwell M.D.   On: 05/17/2016 11:01      Assessment  and Plan: 36 y.o. male with  Right trapezius pain: Very likely to trapezius spasm and scapular dyskinesis. Patient is very resistant to physical therapy. I don't think there is really much other treatment options that are likely to work. We'll readdress her home physical therapy exercises and recheck in the near future. Will refill tramadol. We'll prescribe a limited amount chronically. Patient was researched in the [place] control substance database and he has agreed to a medication use agreement which will be scanned into the computer.  Left knee pain: Likely some overuse or strain while exercising. Possible meniscus injury. Plan for watchful waiting.  Lumbar pain: Likely strain of the  paraspinal muscle group. X-rays unremarkable. Patient does not have significant radicular symptoms. Plan for watchful waiting.   Viagra refilled    Orders Placed This Encounter  Procedures  . DG Lumbar Spine Complete    Standing Status:   Future    Number of Occurrences:   1    Standing Expiration Date:   07/15/2017    Order Specific Question:   Reason for Exam (SYMPTOM  OR DIAGNOSIS REQUIRED)    Answer:   eval lspine pain    Order Specific Question:   Preferred imaging location?    Answer:   [facility]    Discussed warning signs or symptoms. Please see discharge instructions. Patient expresses understanding.

## 2016-05-18 ENCOUNTER — Encounter: Payer: Self-pay | Admitting: Family Medicine

## 2016-09-27 ENCOUNTER — Encounter: Payer: Self-pay | Admitting: Family Medicine

## 2016-10-05 ENCOUNTER — Ambulatory Visit (INDEPENDENT_AMBULATORY_CARE_PROVIDER_SITE_OTHER): Payer: Managed Care, Other (non HMO) | Admitting: Family Medicine

## 2016-10-05 ENCOUNTER — Encounter: Payer: Self-pay | Admitting: Family Medicine

## 2016-10-05 VITALS — BP 137/86 | HR 77 | Ht 71.0 in | Wt 198.0 lb

## 2016-10-05 DIAGNOSIS — M25562 Pain in left knee: Secondary | ICD-10-CM | POA: Diagnosis not present

## 2016-10-05 DIAGNOSIS — M545 Low back pain: Secondary | ICD-10-CM | POA: Diagnosis not present

## 2016-10-05 DIAGNOSIS — N529 Male erectile dysfunction, unspecified: Secondary | ICD-10-CM | POA: Diagnosis not present

## 2016-10-05 DIAGNOSIS — G8929 Other chronic pain: Secondary | ICD-10-CM | POA: Diagnosis not present

## 2016-10-05 DIAGNOSIS — R6882 Decreased libido: Secondary | ICD-10-CM

## 2016-10-05 DIAGNOSIS — M67442 Ganglion, left hand: Secondary | ICD-10-CM

## 2016-10-05 DIAGNOSIS — M62838 Other muscle spasm: Secondary | ICD-10-CM

## 2016-10-05 HISTORY — DX: Decreased libido: R68.82

## 2016-10-05 MED ORDER — SILDENAFIL CITRATE 20 MG PO TABS: ORAL_TABLET | ORAL | 12 refills | Status: DC

## 2016-10-05 MED ORDER — TRAMADOL HCL 50 MG PO TABS: 50.0000 mg | ORAL_TABLET | Freq: Three times a day (TID) | ORAL | 5 refills | Status: DC | PRN

## 2016-10-05 NOTE — Patient Instructions (Signed)
Thank you for coming in today. Get labs fasting soon at any [facility] location.  Labs should be before 10am.   Take it easy with left hand activity.   The ganglion cyst should go away.  Use tramadol sparingly.    Ganglion Cyst A ganglion cyst is a noncancerous, fluid-filled lump that occurs near joints or tendons. The ganglion cyst grows out of a joint or the lining of a tendon. It most often develops in the hand or wrist, but it can also develop in the shoulder, elbow, hip, knee, ankle, or foot. The round or oval ganglion cyst can be the size of a pea or larger than a grape. Increased activity may enlarge the size of the cyst because more fluid starts to build up. What are the causes? It is not known what causes a ganglion cyst to grow. However, it may be related to:  Inflammation or irritation around the joint.  An injury.  Repetitive movements or overuse.  Arthritis.  What increases the risk? Risk factors include:  Being a woman.  Being age 34-50.  What are the signs or symptoms? Symptoms may include:  A lump. This most often appears on the hand or wrist, but it can occur in other areas of the body.  Tingling.  Pain.  Numbness.  Muscle weakness.  Weak grip.  Less movement in a joint.  How is this diagnosed? Ganglion cysts are most often diagnosed based on a physical exam. Your health care provider will feel the lump and may shine a light alongside it. If it is a ganglion cyst, a light often shines through it. Your health care provider may order an X-ray, ultrasound, or MRI to rule out other conditions. How is this treated? Ganglion cysts usually go away on their own without treatment. If pain or other symptoms are involved, treatment may be needed. Treatment is also needed if the ganglion cyst limits your movement or if it gets infected. Treatment may include:  Wearing a brace or splint on your wrist or finger.  Taking anti-inflammatory medicine.  Draining  fluid from the lump with a needle (aspiration).  Injecting a steroid into the joint.  Surgery to remove the ganglion cyst.  Follow these instructions at home:  Do not press on the ganglion cyst, poke it with a needle, or hit it.  Take medicines only as directed by your health care provider.  Wear your brace or splint as directed by your health care provider.  Watch your ganglion cyst for any changes.  Keep all follow-up visits as directed by your health care provider. This is important. Contact a health care provider if:  Your ganglion cyst becomes larger or more painful.  You have increased redness, red streaks, or swelling.  You have pus coming from the lump.  You have weakness or numbness in the affected area.  You have a fever or chills. This information is not intended to replace advice given to you by your health care provider. Make sure you discuss any questions you have with your health care provider. Document Released: 03/25/2000 Document Revised: 09/03/2015 Document Reviewed: 09/10/2013 Elsevier Interactive Patient Education  2018 [employer]

## 2016-10-05 NOTE — Progress Notes (Signed)
Michael Castaneda is a 36 y.o. male who presents to [name]: Primary Care Sports Medicine today for follow-up decreased libido and erectile dysfunction as well as left finger nodule and left knee pain and chronic back pain.  Decreased libido: Patient has decreased libido and erectile dysfunction. He uses sildenafil which works well. He notes his testosterone was checked a few years ago and was just barely high enough to be normal range at 314. He notes his symptoms are quite bothersome and he would like repeat testing today. Possible.  Additionally patient has a bothersome tender nodule at the flexor MCP of the left fourth digit. He notices interferes with grip at work and lifting weights at the gym. It's been present for a few weeks and is bothersome. He denies any triggering of his finger.  Left knee pain: Patient has been evaluating previously for left knee pain. He notes anterior medial knee pain worse when he is doing squats. He notes impaired ankle flexion bilaterally and has to externally rotate his feet to squat. The pain is now 0 out of 10 but at times can be quite severe. He denies locking catching or giving way.  Patient notes continued chronic back pain. This is typically well controlled with tramadol. He typically takes 1 tablet per day sometimes 2. Used to work in the Advertising copywriter at [employer]. The medication allows him to work normally.   Past Medical History:  Diagnosis Date  . Low libido 10/05/2016   Past Surgical History:  Procedure Laterality Date  . APPENDECTOMY     Social History  Substance Use Topics  . Smoking status: Former Games developer  . Smokeless tobacco: Former Neurosurgeon  . Alcohol use No   family history includes Heart disease in his father.  ROS as above:  Medications: Current Outpatient Prescriptions  Medication Sig Dispense Refill  . sildenafil (REVATIO)  20 MG tablet 1-2 tablets by mouth prior to onset of sexual activity. 50 tablet 12  . traMADol (ULTRAM) 50 MG tablet Take 1 tablet (50 mg total) by mouth every 8 (eight) hours as needed. 30 tablet 5   No current facility-administered medications for this visit.    No Known Allergies  Health Maintenance Health Maintenance  Topic Date Due  . HIV Screening  05/20/1997  . INFLUENZA VACCINE  04/12/2023 (Originally 11/09/2016)  . TETANUS/TDAP  12/08/2025     Exam:  BP 137/86   Pulse 77   Ht 5\' 11"  (1.803 m)   Wt 198 lb (89.8 kg)   BMI 27.62 kg/m  Gen: Well NAD HEENT: EOMI,  MMM Lungs: Normal work of breathing. CTABL Heart: RRR no MRG Abd: NABS, Soft. Nondistended, Nontender Exts: Brisk capillary refill, warm and well perfused.  MSK: L-spine normal motion normal gait  Left knee: Normal-appearing  normal motion  nontender.  Normal ligamentous exam. Negative McMurray's test Normal strength  Left hand: Normal-appearing. Small several millimeter palpable mobile nodule just proximal to the MCP palmar fourth digit. No triggering. Normal finger strength and sensation.  Procedure: Real-time Ultrasound Guided Injection of left fourth digit flexor tendon sheath ganglion cyst.  Device: GE Logiq E  Images permanently stored and available for review in the ultrasound unit. Verbal informed consent obtained. Discussed risks and benefits of procedure. Warned about infection bleeding damage to structures skin hypopigmentation and fat atrophy among others. Patient expresses understanding and agreement Time-out conducted.  Noted no overlying erythema, induration, or other signs of local infection. Marland Kitchen  Area inspected with ultrasound revealing a small 2 mm to 3 mm ganglion cyst just superficial to the flexor tendon sheath at the fourth MCP Skin prepped in a sterile fashion.  Local anesthesia: Topical Ethyl chloride.  With sterile technique and under real time ultrasound guidance: 5 mg of  dexamethasone and 1 mL of lidocaine injected with percutaneous puncture of the ganglion cyst easily.  Completed without difficulty  Advised to call if fevers/chills, erythema, induration, drainage, or persistent bleeding.  Images permanently stored and available for review in the ultrasound unit.  Impression: Technically successful ultrasound guided injection.    Lab Results  Component Value Date   TESTOSTERONE 316 06/04/2015      Assessment and Plan: [age] y.o. male with  Low libido and erectile dysfunction. This is concerning for low testosterone. Testosterone was on the low end of normal previously. We'll recheck testosterone along with the free and total labs. Continue sildenafil as needed.  Finger nodule is a ganglion cyst on the flexor tendon sheath. This was injected today. Recommend relative rest for a few days.  Knee pain: I suspect patient has a anterior medial meniscus degenerative tear. Symptoms are quite mild and does not have any mechanical symptoms at this time. Plan for watchful waiting. Recommend modification of activity.  Back pain: Patient has chronic back pain. He seems to do quite well with daily tramadol. Tramadol refill.  [facility] [place] [facility] [place].    Orders Placed This Encounter  Procedures  . Testosterone Total,Free,Bio, Males   Meds ordered this encounter  Medications  . traMADol (ULTRAM) 50 MG tablet    Sig: Take 1 tablet (50 mg total) by mouth every 8 (eight) hours as needed.    Dispense:  30 tablet    Refill:  5  . sildenafil (REVATIO) 20 MG tablet    Sig: 1-2 tablets by mouth prior to onset of sexual activity.    Dispense:  50 tablet    Refill:  12     Discussed warning signs or symptoms. Please see discharge instructions. Patient expresses understanding.  I spent 40 minutes with this patient, greater than 50% was face-to-face time counseling regarding the above diagnosis.

## 2016-10-10 LAB — TESTOSTERONE TOTAL,FREE,BIO, MALES
ALBUMIN: 4 g/dL (ref 3.6–5.1)
Sex Hormone Binding: 27 nmol/L (ref 10–50)
TESTOSTERONE BIOAVAILABLE: 121.2 ng/dL (ref 110.0–575.0)
Testosterone, Free: 65.9 pg/mL (ref 46.0–224.0)
Testosterone: 411 ng/dL (ref 250–827)

## 2017-02-14 ENCOUNTER — Encounter: Payer: Self-pay | Admitting: Family Medicine

## 2017-02-14 ENCOUNTER — Ambulatory Visit: Payer: Managed Care, Other (non HMO) | Admitting: Family Medicine

## 2017-02-14 ENCOUNTER — Ambulatory Visit (INDEPENDENT_AMBULATORY_CARE_PROVIDER_SITE_OTHER): Payer: Managed Care, Other (non HMO)

## 2017-02-14 VITALS — BP 139/84 | HR 96 | Wt 196.0 lb

## 2017-02-14 DIAGNOSIS — M25571 Pain in right ankle and joints of right foot: Secondary | ICD-10-CM | POA: Diagnosis not present

## 2017-02-14 DIAGNOSIS — R6882 Decreased libido: Secondary | ICD-10-CM

## 2017-02-14 NOTE — Patient Instructions (Signed)
Thank you for coming in today. Get xray today.  Recheck testosterone fasting in the near future before 10 am.  Recheck as needed,

## 2017-02-15 NOTE — Progress Notes (Signed)
Michael Castaneda is a 36 y.o. male who [name] today for foot pain and low libido.  Patient notes pain in the anterior right ankle especially with a deep squat position involving ankle dorsiflexion.  He is concerned about bone impingement.  He does not have pain otherwise he feels well otherwise.  No fevers or chills.  Low libido: Michael Castaneda notes continued low libido and erectile dysfunction.  He is concerned about low testosterone.  He has had low testosterone values previously.  His testosterone was recently checked in June however he was not fasting and it was not in the morning thereby nullify the validity of this test.   Past Medical History:  Diagnosis Date  . Low libido 10/05/2016   Past Surgical History:  Procedure Laterality Date  . APPENDECTOMY     Social History   Tobacco Use  . Smoking status: Former Games developermoker  . Smokeless tobacco: Former Engineer, waterUser  Substance Use Topics  . Alcohol use: No    Alcohol/week: 0.0 oz   family history includes Heart disease in his father.  ROS as above:  Medications: Current Outpatient Medications  Medication Sig Dispense Refill  . sildenafil (REVATIO) 20 MG tablet 1-2 tablets by mouth prior to onset of sexual activity. 50 tablet 12  . traMADol (ULTRAM) 50 MG tablet Take 1 tablet (50 mg total) by mouth every 8 (eight) hours as needed. 30 tablet 5   No current facility-administered medications for this visit.    No Known Allergies  Health Maintenance Health Maintenance  Topic Date Due  . HIV Screening  05/20/1997  . INFLUENZA VACCINE  04/12/2023 (Originally 11/09/2016)  . TETANUS/TDAP  12/08/2025     Exam:  BP 139/84   Pulse 96   Wt 196 lb (88.9 kg)   SpO2 97%   BMI 27.34 kg/m  Gen: Well NAD HEENT: EOMI,  MMM Lungs: Normal work of breathing. CTABL Heart: RRR no MRG Abd: NABS, Soft. Nondistended,  Nontender Exts: Brisk capillary refill, warm and well perfused.  Right ankle normal-appearing slight bony prominence and bossing present on exam.  Nontender.  Normal motion.    Xray right ankle: No acute fracture.  Some bony bossing is present.  Awaiting formal radiology review  Lab Results  Component Value Date   TESTOSTERONE 411 10/08/2016     Assessment and Plan: 36 y.o. male with right ankle pain with decreased dorsiflexion range of motion likely bony impingement.  We discussed options.  Based on x-ray results we will likely proceed with watchful waiting.  Low libido: Check testosterone fasting in the low or very close to low I think it is reasonable to proceed with treatment.  We discussed various options including injections and gel.  We also discussed that were going to practice evidence-based medicine.  I am not going to treat low libido with testosterone supplementation when the testosterone well into the normal range.   Orders Placed This Encounter  Procedures  . DG Ankle Complete Right    Standing Status:   Future    Number of Occurrences:   1    Standing Expiration Date:   04/16/2018    Order Specific Question:   Reason for Exam (SYMPTOM  OR DIAGNOSIS REQUIRED)    Answer:   eval anterior ankle pain with squats. ? bony impringement    Order Specific Question:   Preferred imaging location?    Answer[facility]   [facility]  Order Specific Question:   Radiology Contrast Protocol - do NOT remove file path    Answer:   [email or web address].pdf  . Testosterone   No orders of the defined types were placed in this encounter.    Discussed warning signs or symptoms. Please see discharge instructions. Patient expresses understanding.

## 2017-06-04 ENCOUNTER — Other Ambulatory Visit: Payer: Self-pay | Admitting: Family Medicine

## 2017-06-04 DIAGNOSIS — M62838 Other muscle spasm: Secondary | ICD-10-CM

## 2017-06-29 ENCOUNTER — Other Ambulatory Visit: Payer: Self-pay | Admitting: Family Medicine

## 2017-06-29 DIAGNOSIS — M62838 Other muscle spasm: Secondary | ICD-10-CM

## 2017-07-25 ENCOUNTER — Ambulatory Visit: Payer: Managed Care, Other (non HMO) | Admitting: Family Medicine

## 2017-07-25 ENCOUNTER — Encounter: Payer: Self-pay | Admitting: Family Medicine

## 2017-07-25 VITALS — BP 130/88 | HR 80 | Temp 98.2°F | Ht 71.0 in | Wt 186.0 lb

## 2017-07-25 DIAGNOSIS — Z114 Encounter for screening for human immunodeficiency virus [HIV]: Secondary | ICD-10-CM

## 2017-07-25 DIAGNOSIS — G4700 Insomnia, unspecified: Secondary | ICD-10-CM

## 2017-07-25 DIAGNOSIS — R6882 Decreased libido: Secondary | ICD-10-CM

## 2017-07-25 DIAGNOSIS — N529 Male erectile dysfunction, unspecified: Secondary | ICD-10-CM

## 2017-07-25 DIAGNOSIS — F5232 Male orgasmic disorder: Secondary | ICD-10-CM | POA: Diagnosis not present

## 2017-07-25 DIAGNOSIS — R21 Rash and other nonspecific skin eruption: Secondary | ICD-10-CM | POA: Diagnosis not present

## 2017-07-25 DIAGNOSIS — Z113 Encounter for screening for infections with a predominantly sexual mode of transmission: Secondary | ICD-10-CM

## 2017-07-25 MED ORDER — BUPROPION HCL ER (XL) 150 MG PO TB24: 150.0000 mg | ORAL_TABLET | ORAL | 0 refills | Status: DC

## 2017-07-25 MED ORDER — SILDENAFIL CITRATE 20 MG PO TABS: ORAL_TABLET | ORAL | 12 refills | Status: DC

## 2017-07-25 MED ORDER — ZOLPIDEM TARTRATE 10 MG PO TABS: 10.0000 mg | ORAL_TABLET | Freq: Every evening | ORAL | 3 refills | Status: DC | PRN

## 2017-07-25 NOTE — Patient Instructions (Signed)
Thank you for coming in today. Start Wellbutrin and [place].  Report back with results.  Get fasting labs in the near future.

## 2017-07-25 NOTE — Progress Notes (Signed)
Michael Castaneda is a 37 y.o. male who presents to [name]: Primary Care Sports Medicine today for delayed orgasm and insomnia and pain.   Delayed Orgasm. [name] has had sexual dysfunction in the past. He notes low libido and ED. He had a low normal testosterone level last year and is doing well with viagra. He notes however that he has significant problems getting to orgasm. He has had this problem in the past and is worsening. He thinks the tramadol may make it worse but is not sure. He does not take any other medications.   Additionally [name] notes continued insomnia. He has trouble with falling back asleep when he wakes up. He had tried changing his sleep routine which has not helped.  He feels tired when he does not get enough sleep.   Chronic Pain. [name] has chronic right trapezius pain failed conservative management. He was using tramadol for the pain and notes that it does not help much at all.   Additionally he would like STD testing. He is with a new girlfriend for the last 6 months. He denies any significant symptom aside from mild skin irritation at the shaft of his penis recently. He notes that his GF recently has a yeast infection.  He denies any dysuria or penile discharge.  No treatment tried yet   Past Medical History:  Diagnosis Date  . Low libido 10/05/2016   Past Surgical History:  Procedure Laterality Date  . APPENDECTOMY     Social History   Tobacco Use  . Smoking status: Former Games developer  . Smokeless tobacco: Former Engineer, water Use Topics  . Alcohol use: No    Alcohol/week: 0.0 oz   family history includes Heart disease in his father.  ROS as above: No fevers chills nausea vomiting diarrhea chest pain palpitations shortness of breath.  Medications: Current Outpatient Medications  Medication Sig Dispense Refill  . buPROPion (WELLBUTRIN XL) 150 MG 24 hr tablet Take 1  tablet (150 mg total) by mouth every morning. 90 tablet 0  . sildenafil (REVATIO) 20 MG tablet 2-5 tablets by mouth prior to onset of sexual activity. 90 tablet 12  . zolpidem (AMBIEN) 10 MG tablet Take 1 tablet (10 mg total) by mouth at bedtime as needed for sleep. 30 tablet 3   No current facility-administered medications for this visit.    No Known Allergies  Health Maintenance Health Maintenance  Topic Date Due  . HIV Screening  05/20/1997  . INFLUENZA VACCINE  04/12/2023 (Originally 11/09/2017)  . TETANUS/TDAP  12/08/2025     Exam:  BP 130/88   Pulse 80   Temp 98.2 F (36.8 C) (Oral)   Ht 5\' 11"  (1.803 m)   Wt 186 lb (84.4 kg)   BMI 25.94 kg/m  Gen: Well NAD HEENT: EOMI,  MMM Lungs: Normal work of breathing. CTABL Heart: RRR no MRG Abd: NABS, Soft. Nondistended, Nontender Exts: Brisk capillary refill, warm and well perfused.  C-spine: Nontender to midline.  Mildly tender to palpation right trapezius. Genitals: Penis is circumcised no discharge or significant lesions.  Small occasional erytheremtus patches on the shaft of the penis present.  Testicles are normal sized with no masses or pain.  Psych: Alert and oriented. Normal affect and speech and thought process. No SI/HI.     Assessment and Plan: 37 y.o. male with  Delayed Orgasm. Unclear etiology. Plan for a trial of bupropion and consider sex therapy. STOP tramadol as the  SSRI like effects may worsen delayed orgasm. Check Testosterone and TSH.  Recheck in 1 month or so.   Insomnia: Trial of ambien. Would like to avoid trazodone for possible sexual side effects.  Pain: STOP tramadol. Will hold on opiates for not an reassess in the near future.   STD screening as below.   Penis rash: Likely skin irritation. Recommend using OTC vaginal yeast infection cream 2x daily for 5-7 days. Recheck if not better. STD screening as below.    Orders Placed This Encounter  Procedures  . C. trachomatis/N. gonorrhoeae RNA  .  CBC  . CBC with Differential/Platelet  . Testosterone  . TSH  . HIV antibody  . RPR   Meds ordered this encounter  Medications  . buPROPion (WELLBUTRIN XL) 150 MG 24 hr tablet    Sig: Take 1 tablet (150 mg total) by mouth every morning.    Dispense:  90 tablet    Refill:  0  . sildenafil (REVATIO) 20 MG tablet    Sig: 2-5 tablets by mouth prior to onset of sexual activity.    Dispense:  90 tablet    Refill:  12  . zolpidem (AMBIEN) 10 MG tablet    Sig: Take 1 tablet (10 mg total) by mouth at bedtime as needed for sleep.    Dispense:  30 tablet    Refill:  3     Discussed warning signs or symptoms. Please see discharge instructions. Patient expresses understanding.

## 2017-07-26 LAB — C. TRACHOMATIS/N. GONORRHOEAE RNA
C. TRACHOMATIS RNA, TMA: NOT DETECTED
N. gonorrhoeae RNA, TMA: NOT DETECTED

## 2017-08-09 ENCOUNTER — Other Ambulatory Visit: Payer: Self-pay | Admitting: Family Medicine

## 2017-08-09 DIAGNOSIS — N529 Male erectile dysfunction, unspecified: Secondary | ICD-10-CM

## 2017-09-13 ENCOUNTER — Telehealth: Payer: Self-pay

## 2017-09-13 NOTE — Telephone Encounter (Signed)
Michael Castaneda states he has dropped half his Ambien pills down the sink. He would like a early refill. Please advise.

## 2017-09-14 MED ORDER — ZOLPIDEM TARTRATE 10 MG PO TABS: 10.0000 mg | ORAL_TABLET | Freq: Every evening | ORAL | 3 refills | Status: DC | PRN

## 2017-09-14 NOTE — Telephone Encounter (Signed)
Early refill 1x only ok. New rx sent to pharmacy.

## 2017-09-14 NOTE — Telephone Encounter (Signed)
Pharmacy given the ok to fill early.

## 2017-09-21 ENCOUNTER — Ambulatory Visit: Payer: Managed Care, Other (non HMO) | Admitting: Family Medicine

## 2017-09-21 ENCOUNTER — Encounter: Payer: Self-pay | Admitting: Family Medicine

## 2017-09-21 VITALS — BP 138/91 | HR 92 | Ht 71.0 in | Wt 230.0 lb

## 2017-09-21 DIAGNOSIS — M62838 Other muscle spasm: Secondary | ICD-10-CM | POA: Diagnosis not present

## 2017-09-21 DIAGNOSIS — G4700 Insomnia, unspecified: Secondary | ICD-10-CM

## 2017-09-21 DIAGNOSIS — F329 Major depressive disorder, single episode, unspecified: Secondary | ICD-10-CM | POA: Insufficient documentation

## 2017-09-21 DIAGNOSIS — Z113 Encounter for screening for infections with a predominantly sexual mode of transmission: Secondary | ICD-10-CM

## 2017-09-21 DIAGNOSIS — M545 Low back pain: Secondary | ICD-10-CM | POA: Diagnosis not present

## 2017-09-21 DIAGNOSIS — G8929 Other chronic pain: Secondary | ICD-10-CM | POA: Insufficient documentation

## 2017-09-21 DIAGNOSIS — R4589 Other symptoms and signs involving emotional state: Secondary | ICD-10-CM

## 2017-09-21 DIAGNOSIS — G894 Chronic pain syndrome: Secondary | ICD-10-CM

## 2017-09-21 MED ORDER — HYDROCODONE-ACETAMINOPHEN 5-325 MG PO TABS: 1.0000 | ORAL_TABLET | Freq: Four times a day (QID) | ORAL | 0 refills | Status: DC | PRN

## 2017-09-21 MED ORDER — TRAZODONE HCL 50 MG PO TABS: 25.0000 mg | ORAL_TABLET | Freq: Every evening | ORAL | 3 refills | Status: DC | PRN

## 2017-09-21 NOTE — Progress Notes (Signed)
Michael Castaneda is a 37 y.o. male who [name] today for discuss back pain, mood, and insomnia.  Client continues to experience chronic trapezius and back pain.  In the past he had reasonable pain control with tramadol but notes that the side effects including creepy crawly skin and feeling jittery were obnoxious and were discontinued.  Additionally opiates were discontinued because he was on Ambien for insomnia.  He would like to wean off of Ambien and consider other medications for pain control if possible.  He notes off of opiates using over-the-counter medications and other treatments including heating pad stretch and TENS unit as well as a history of trying physical therapy have not been effective.  Additionally he notes insomnia has been problematic.  He is been on Ambien for a while.  He does not think he is ever tried trazodone in the past.  He would like to consider trazodone if possible  Mood: Client notes that his mood has been worsening recently.  He notes that he recently found out that his girlfriend was cheating on him and they recently broke up.  He he feels poorly about this and notes that his sleep is not been very well controlled recently as well and he thinks his mood is been a bit worse.  He does not think he is quite bad enough that he needs medicine at this point but would consider that in the future if needed.   ROS as above:  Exam:  BP (!) 138/91   Pulse 92   Ht 5\' 11"  (1.803 m)   Wt 230 lb (104.3 kg)   BMI 32.08 kg/m  Gen: Well NAD HEENT: EOMI,  MMM Lungs: Normal work of breathing. CTABL Heart: RRR no MRG Abd: NABS, Soft. Nondistended, Nontender Exts: Brisk capillary refill, warm and well perfused.  Spine: Tender to palpation trapezius.  Normal spinal motion.  Normal gait. Psych alert and oriented normal speech thought process and  affect no active SI or HI expressed. Genitals: Small pearly papules at the base of the shaft of the penis.  No other lesions.  No discharge.  Testicles descended bilaterally.   Depression screen [facility] 2/9 09/21/2017 07/29/2015 05/27/2015 05/11/2015  Decreased Interest 1 0 0 0  Down, Depressed, Hopeless 2 0 0 0  PHQ - 2 Score 3 0 0 0  Altered sleeping 3 - - -  Tired, decreased energy 3 - - -  Change in appetite 1 - - -  Feeling bad or failure about yourself  2 - - -  Trouble concentrating 1 - - -  Moving slowly or fidgety/restless 1 - - -  Suicidal thoughts 2 - - -  PHQ-9 Score 16 - - -  Difficult doing work/chores Very difficult - - -   GAD 7 : Generalized Anxiety Score 09/21/2017  Nervous, Anxious, on Edge 2  Control/stop worrying 2  Worry too much - different things 2  Trouble relaxing 2  Restless 3  Easily annoyed or irritable 2  Afraid - awful might happen 1  Total GAD 7 Score 14  Anxiety Difficulty Very difficult    Lab and Radiology Results Gonorrhea chlamydia screening from April reviewed   Assessment and Plan: 37 y.o. male with  Pain: We will switch to Norco.  Wean off of Ambien.  Continue current pain contract.  Okay to refill 30 tablets/month for 3 months.  Recheck in 3 months Patient [place] Sub[place].  Insomnia: Wean off of Ambien.  Trial of trazodone.  Mood: Worsening recently possibly situational due to insomnia.  We are starting trazodone now.  Plan to proceed with watchful waiting for 2 weeks.  If symptoms are not improving or if they are worsening in 2 weeks likely will start SSRI therapy and will recheck sooner than 3 months. If all is well recheck 3 months. Verbal contract for safety.    STD screening: Labs ordered at the last visit are pending now.  This includes HIV and RPR.  Gonorrhea and Chlamydia are negative recently.  Orders Placed This Encounter  Procedures  . C. trachomatis/N. gonorrhoeae RNA   Meds  ordered this encounter  Medications  . traZODone (DESYREL) 50 MG tablet    Sig: Take 0.5-1 tablets (25-50 mg total) by mouth at bedtime as needed for sleep.    Dispense:  30 tablet    Refill:  3  . HYDROcodone-acetaminophen (NORCO/VICODIN) 5-325 MG tablet    Sig: Take 1 tablet by mouth every 6 (six) hours as needed.    Dispense:  30 tablet    Refill:  0     Historical information moved to improve visibility of documentation.  Past Medical History:  Diagnosis Date  . Low libido 10/05/2016   Past Surgical History:  Procedure Laterality Date  . APPENDECTOMY     Social History   Tobacco Use  . Smoking status: Former Games developer  . Smokeless tobacco: Former Engineer, water Use Topics  . Alcohol use: No    Alcohol/week: 0.0 oz   family history includes Heart disease in his father.  Medications: Current Outpatient Medications  Medication Sig Dispense Refill  . buPROPion (WELLBUTRIN XL) 150 MG 24 hr tablet Take 1 tablet (150 mg total) by mouth every morning. 90 tablet 0  . sildenafil (REVATIO) 20 MG tablet TAKE ONE - TWO TABLETS BY MOUTH PRIOR TO ONSET OF SEXUAL ACTIVITY. 50 tablet 8  . HYDROcodone-acetaminophen (NORCO/VICODIN) 5-325 MG tablet Take 1 tablet by mouth every 6 (six) hours as needed. 30 tablet 0  . traZODone (DESYREL) 50 MG tablet Take 0.5-1 tablets (25-50 mg total) by mouth at bedtime as needed for sleep. 30 tablet 3   No current facility-administered medications for this visit.    No Known Allergies  Health Maintenance Health Maintenance  Topic Date Due  . HIV Screening  05/20/1997  . INFLUENZA VACCINE  04/12/2023 (Originally 11/09/2017)  . TETANUS/TDAP  12/08/2025    Discussed warning signs or symptoms. Please see discharge instructions. Patient expresses understanding.

## 2017-09-21 NOTE — Patient Instructions (Signed)
Thank you for coming in today. Recheck with me via Mychart in 2 weeks  Start trazodone for insomina Start norco for pain daily as needed Next physical check back is in 3 months if doing well.  If mood does not not improve we will start treatment.   Get labs fasting soon.

## 2017-09-22 LAB — C. TRACHOMATIS/N. GONORRHOEAE RNA
C. TRACHOMATIS RNA, TMA: NOT DETECTED
N. GONORRHOEAE RNA, TMA: NOT DETECTED

## 2017-10-04 ENCOUNTER — Other Ambulatory Visit: Payer: Self-pay | Admitting: Family Medicine

## 2017-10-05 NOTE — Telephone Encounter (Signed)
At last office visit with you 6/18 it states patient did not want to be on antidepressants but I do see where he has been on this within last few months. Please advise

## 2017-10-20 ENCOUNTER — Telehealth: Payer: Self-pay | Admitting: Family Medicine

## 2017-10-20 MED ORDER — HYDROCODONE-ACETAMINOPHEN 5-325 MG PO TABS: 1.0000 | ORAL_TABLET | Freq: Four times a day (QID) | ORAL | 0 refills | Status: DC | PRN

## 2017-10-20 NOTE — Telephone Encounter (Signed)
Norco refilled

## 2017-11-15 ENCOUNTER — Other Ambulatory Visit: Payer: Self-pay

## 2017-11-15 MED ORDER — HYDROCODONE-ACETAMINOPHEN 5-325 MG PO TABS: 1.0000 | ORAL_TABLET | Freq: Four times a day (QID) | ORAL | 0 refills | Status: DC | PRN

## 2017-12-05 ENCOUNTER — Other Ambulatory Visit: Payer: Self-pay | Admitting: Family Medicine

## 2017-12-13 ENCOUNTER — Other Ambulatory Visit: Payer: Self-pay | Admitting: *Deleted

## 2017-12-13 MED ORDER — HYDROCODONE-ACETAMINOPHEN 5-325 MG PO TABS: 1.0000 | ORAL_TABLET | Freq: Four times a day (QID) | ORAL | 0 refills | Status: DC | PRN

## 2017-12-13 NOTE — Telephone Encounter (Signed)
Pharmacy called on behalf of the pt for a Vicodin refill.

## 2018-01-01 ENCOUNTER — Ambulatory Visit (INDEPENDENT_AMBULATORY_CARE_PROVIDER_SITE_OTHER): Payer: Managed Care, Other (non HMO) | Admitting: Family Medicine

## 2018-01-01 VITALS — BP 128/78 | HR 67 | Ht 71.0 in | Wt 193.0 lb

## 2018-01-01 DIAGNOSIS — M5412 Radiculopathy, cervical region: Secondary | ICD-10-CM

## 2018-01-01 MED ORDER — PREDNISONE 5 MG (48) PO TBPK: ORAL_TABLET | ORAL | 0 refills | Status: DC

## 2018-01-01 MED ORDER — HYDROCODONE-ACETAMINOPHEN 5-325 MG PO TABS: 1.0000 | ORAL_TABLET | Freq: Four times a day (QID) | ORAL | 0 refills | Status: DC | PRN

## 2018-01-01 MED ORDER — GABAPENTIN 300 MG PO CAPS: ORAL_CAPSULE | ORAL | 3 refills | Status: DC

## 2018-01-01 NOTE — Progress Notes (Signed)
Michael Castaneda is a 37 y.o. male who presents to [name]: Primary Care Sports Medicine today for Left shoulder and arm pain.  Symptoms present for about 2 weeks occurring without injury.  He has pain radiating down his left arm to his thumb.  He notes pain is present with overhead lifting but typically is better when he puts his arm over his head.  He denies any locking or catching.  He denies any weakness or numbness.  No fevers or chills nausea vomiting or diarrhea.  No specific treatment tried yet.  He feels well otherwise.  He notes he has been using his hydrocodone medicine that he uses for back pain for his left shoulder pain.    ROS as above:  Exam:  BP 128/78   Pulse 67   Ht 5\' 11"  (1.803 m)   Wt 193 lb (87.5 kg)   BMI 26.92 kg/m  Wt Readings from Last 5 Encounters:  01/01/18 193 lb (87.5 kg)  09/21/17 230 lb (104.3 kg)  07/25/17 186 lb (84.4 kg)  02/14/17 196 lb (88.9 kg)  10/05/16 198 lb (89.8 kg)    Gen: Well NAD HEENT: EOMI,  MMM Lungs: Normal work of breathing. CTABL Heart: RRR no MRG Abd: NABS, Soft. Nondistended, Nontender Exts: Brisk capillary refill, warm and well perfused.  MSK: C-spine nontender to spinal midline.  Normal neck motion.  Negative Spurling's test. Left shoulder normal-appearing nontender normal motion.  Negative impingement testing.  Mildly positive empty can test.  Strength is intact. Negative Yergason's and speeds test. Upper extremity reflexes sensation and strength are equal normal bilaterally.  Lab and Radiology Results EXAM: CERVICAL SPINE - 2-3 VIEW  COMPARISON:  None.  FINDINGS: There is no evidence of cervical spine fracture or prevertebral soft tissue swelling. Alignment is normal. No other significant bone abnormalities are identified.  IMPRESSION: Negative cervical spine radiographs.   Electronically Signed   By: Marlan Palau M.D.   On: 05/11/2015 16:18 I personally (independently) visualized and performed the interpretation of the images attached in this note.    Assessment and Plan: 37 y.o. male with  Left shoulder and arm pain likely C6 radiculopathy.  Patient may have some rotator cuff tendinopathy as well.  Plan for trial of prednisone Dosepak and gabapentin.  Limited hydrocodone for pain control.  Recheck in a few weeks or sooner if needed.  Return sooner if not improving.  Next step would be x-ray and potentially MRI for epidural steroid injection planning.    No orders of the defined types were placed in this encounter.  Meds ordered this encounter  Medications  . predniSONE (STERAPRED UNI-PAK 48 TAB) 5 MG (48) TBPK tablet    Sig: 12 day dosepack po    Dispense:  48 tablet    Refill:  0  . gabapentin (NEURONTIN) 300 MG capsule    Sig: One tab PO qHS for a week, then BID for a week, then TID. May double weekly to a max of 3,600mg /day    Dispense:  180 capsule    Refill:  3  . HYDROcodone-acetaminophen (NORCO/VICODIN) 5-325 MG tablet    Sig: Take 1-2 tablets by mouth every 6 (six) hours as needed. Acute pain in addition to chronic pain    Dispense:  30 tablet    Refill:  0     Historical information moved to improve visibility of documentation.  Past Medical History:  Diagnosis Date  . Low libido 10/05/2016  Past Surgical History:  Procedure Laterality Date  . APPENDECTOMY     Social History   Tobacco Use  . Smoking status: Former Games developermoker  . Smokeless tobacco: Former Engineer, waterUser  Substance Use Topics  . Alcohol use: No    Alcohol/week: 0.0 standard drinks   family history includes Heart disease in his father.  Medications: Current Outpatient Medications  Medication Sig Dispense Refill  . HYDROcodone-acetaminophen (NORCO/VICODIN) 5-325 MG tablet Take 1-2 tablets by mouth every 6 (six) hours as needed. Acute pain in addition to chronic pain 30 tablet 0  . sildenafil (REVATIO) 20 MG  tablet TAKE ONE - TWO TABLETS BY MOUTH PRIOR TO ONSET OF SEXUAL ACTIVITY. 50 tablet 8  . gabapentin (NEURONTIN) 300 MG capsule One tab PO qHS for a week, then BID for a week, then TID. May double weekly to a max of 3,600mg /day 180 capsule 3  . predniSONE (STERAPRED UNI-PAK 48 TAB) 5 MG (48) TBPK tablet 12 day dosepack po 48 tablet 0   No current facility-administered medications for this visit.    No Known Allergies   Discussed warning signs or symptoms. Please see discharge instructions. Patient expresses understanding.

## 2018-01-01 NOTE — Patient Instructions (Addendum)
Thank you for coming in today. Take prednisone for 12 days.  Use gabapentin at bedtime and increase up to 3x daily as needed.  Come back or go to the emergency room if you notice new weakness new numbness problems walking or bowel or bladder problems.  Use norco sparingly.  Return in about 1 month or sooner if needed.    Cervical Radiculopathy Cervical radiculopathy means that a nerve in the neck is pinched or bruised. This can cause pain or loss of feeling (numbness) that runs from your neck to your arm and fingers. Follow these instructions at home: Managing pain  Take over-the-counter and prescription medicines only as told by your doctor.  If directed, put ice on the injured or painful area. ? Put ice in a plastic bag. ? Place a towel between your skin and the bag. ? Leave the ice on for 20 minutes, 2-3 times per day.  If ice does not help, you can try using heat. Take a warm shower or warm bath, or use a heat pack as told by your doctor.  You may try a gentle neck and shoulder massage. Activity  Rest as needed. Follow instructions from your doctor about any activities to avoid.  Do exercises as told by your doctor or physical therapist. General instructions  If you were given a soft collar, wear it as told by your doctor.  Use a flat pillow when you sleep.  Keep all follow-up visits as told by your doctor. This is important. Contact a doctor if:  Your condition does not improve with treatment. Get help right away if:  Your pain gets worse and is not controlled with medicine.  You lose feeling or feel weak in your hand, arm, face, or leg.  You have a fever.  You have a stiff neck.  You cannot control when you poop or pee (have incontinence).  You have trouble with walking, balance, or talking. This information is not intended to replace advice given to you by your health care provider. Make sure you discuss any questions you have with your health care  provider. Document Released: 03/17/2011 Document Revised: 09/03/2015 Document Reviewed: 05/22/2014 Elsevier Interactive Patient Education  [employer]2018 Elsevier Inc.

## 2018-01-24 ENCOUNTER — Telehealth: Payer: Self-pay

## 2018-01-24 MED ORDER — HYDROCODONE-ACETAMINOPHEN 5-325 MG PO TABS: 1.0000 | ORAL_TABLET | Freq: Four times a day (QID) | ORAL | 0 refills | Status: DC | PRN

## 2018-01-24 NOTE — Telephone Encounter (Signed)
Norco refilled

## 2018-01-24 NOTE — Telephone Encounter (Signed)
Patient request refill for Hydrocodone sent to [facility]. Please advise. Michael Castaneda,CMA

## 2018-01-25 NOTE — Telephone Encounter (Signed)
Called patient to advise that Rx was sent to [facility] but did not get an answer or voicemail. Michael Castaneda,CMA

## 2018-02-16 ENCOUNTER — Telehealth: Payer: Self-pay | Admitting: Family Medicine

## 2018-02-16 MED ORDER — HYDROCODONE-ACETAMINOPHEN 5-325 MG PO TABS: 1.0000 | ORAL_TABLET | Freq: Four times a day (QID) | ORAL | 0 refills | Status: DC | PRN

## 2018-02-16 NOTE — Telephone Encounter (Signed)
Hydrocodone sent to [facility] in [place] start date November 15

## 2018-03-01 ENCOUNTER — Ambulatory Visit (INDEPENDENT_AMBULATORY_CARE_PROVIDER_SITE_OTHER): Payer: Managed Care, Other (non HMO)

## 2018-03-01 ENCOUNTER — Ambulatory Visit (INDEPENDENT_AMBULATORY_CARE_PROVIDER_SITE_OTHER): Payer: Managed Care, Other (non HMO) | Admitting: Family Medicine

## 2018-03-01 ENCOUNTER — Encounter: Payer: Self-pay | Admitting: Family Medicine

## 2018-03-01 VITALS — BP 138/89 | HR 84 | Temp 98.1°F | Wt 203.0 lb

## 2018-03-01 DIAGNOSIS — G894 Chronic pain syndrome: Secondary | ICD-10-CM

## 2018-03-01 DIAGNOSIS — M25512 Pain in left shoulder: Secondary | ICD-10-CM

## 2018-03-01 DIAGNOSIS — M545 Low back pain, unspecified: Secondary | ICD-10-CM

## 2018-03-01 DIAGNOSIS — G8929 Other chronic pain: Secondary | ICD-10-CM

## 2018-03-01 MED ORDER — HYDROCODONE-ACETAMINOPHEN 5-325 MG PO TABS: 1.0000 | ORAL_TABLET | Freq: Two times a day (BID) | ORAL | 0 refills | Status: AC | PRN

## 2018-03-01 NOTE — Progress Notes (Signed)
Michael Castaneda is a 37 y.o. male who presents to [facility] today for left shoulder pain and right upper back pain.   Left shoulder: He has had left shoulder pain for two months. He has been doing stretches that he found online, but this has not relieved his pain. He has recently progressed to limited range of motion due to pain and feels like he is losing strength, especially with overhead lifting. 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 He is concerned that his shoulder is going to give out on him.   Otherwise, he used the prednisone and gabapentin and no longer has numbness or tingling. He notes that this problem is completely resolved.  Right upper back: He has continuous right upper back pain, even noting that he is in pain just sitting in a normal chair in the exam room. He has previously been taking 1 Norco per day, but has recently increased to 2 Norco per day. He has used 30 tablets of Norco in the past 15 days. He notes that this does not help his back pain, but does relieve some of his left shoulder pain. He does inquire about increased dose of Norco or something else for pain management.    ROS:  As above  Exam:  BP 138/89   Pulse 84   Temp 98.1 F (36.7 C) (Oral)   Wt 203 lb (92.1 kg)   BMI 28.31 kg/m  General: Well Developed, well nourished, and in no acute distress.  Neuro/Psych: Alert and oriented x3, extra-ocular muscles intact, able to move all 4 extremities, sensation grossly intact. Skin: Warm and dry, no rashes noted.  Respiratory: Not using accessory muscles, speaking in full sentences, trachea midline.  Cardiovascular: Pulses palpable, no extremity edema. Abdomen: Does not appear distended. MSK:  Left shoulder:  No erythema or scars on inspection. No tenderness to palpation.  Limited shoulder abduction due to pain.  Abduction limited to about 100 degrees without pain.  Pain present from  100 -180 degrees. Normal external rotation. Limited internal rotation only reaching level of sacrum.  Strength 5/5 with resisted internal and external rotation.  Positive empty can test. Positive Hawkins and Neer's test. Positive crossover arm compression test. Pulses and capillary refill normal.   Right shoulder:  No TTP.  Normal ROM.  Strength intact and 5/5 with all tests.  Native impingement testing. Pulses and capillary refill normal.   Tender palpation right trapezius and rhomboid area.  Mildly tender palpation lumbar paraspinal musculature.   Lab and Radiology Results X-ray images left shoulder personally independent reviewed.  Mild AC DJD otherwise normal-appearing.  No acute fractures. Await formal radiology review.   Ultrasound guided Left shoulder injection: Injected into the rotator cuff tendon.  Procedure: Real-time Ultrasound Guided Injection of left subacromial bursa Device: GE Logiq E   Images permanently stored and available for review in the ultrasound unit. Verbal informed consent obtained.  Discussed risks and benefits of procedure. Warned about infection bleeding damage to structures skin hypopigmentation and fat atrophy among others. Patient expresses understanding and agreement Time-out conducted.   Noted no overlying erythema, induration, or other signs of local infection.   Skin prepped in a sterile fashion.   Local anesthesia: Topical Ethyl chloride.   With sterile technique and under real time ultrasound guidance:  40 mg of Kenalog and 2 mL of Marcaine injected easily.   Completed without difficulty   Pain immediately  resolved suggesting accurate placement of the medication.   Advised to call if fevers/chills, erythema, induration, drainage, or persistent bleeding.   Images permanently stored and available for review in the ultrasound unit.  Impression: Technically successful ultrasound guided injection.       Assessment and Plan: 37 y.o.  male with  Left shoulder pain: Mr. Compean likely has rotator cuff tendonitis. Did an ultrasound guided left shoulder steroid injection. After injection, he noted that his shoulder felt much better. He had increased ability to internal rotate without pain. He got some exercise bands after his last visit. Showed him rotator cuff tendon exercises. He is unable to afford PT at this time. His x-ray revealed no fractures or obvious deformities.  Follow-up in about 4 weeks.   Right upper back: As his pain is not controlled at 2 tablets of Norco per day, I will refer him to the chronic pain clinic. Advised pt that I cannot prescribe a higher dose. Prescribed enough for one more month of Norco at 2 tablets per day. Continue exercises with band.   Patient researched [facility] Controlled Substance Reporting System.    Orders Placed This Encounter  Procedures  . DG Shoulder Left    Standing Status:   Future    Number of Occurrences:   1    Standing Expiration Date:   05/02/2019    Order Specific Question:   Reason for Exam (SYMPTOM  OR DIAGNOSIS REQUIRED)    Answer:   eval shoudler pain ? bursitis vs AC djd    Order Specific Question:   Preferred imaging location?    Answer:   [facility]    Order Specific Question:   Radiology Contrast Protocol - do NOT remove file path    Answer:   [email or web address]  . Ambulatory referral to Pain Clinic    Referral Priority:   Routine    Referral Type:   Consultation    Referral Reason:   Specialty Services Required    Requested Specialty:   Pain Medicine    Number of Visits Requested:   1   Meds ordered this encounter  Medications  . HYDROcodone-acetaminophen (NORCO/VICODIN) 5-325 MG tablet    Sig: Take 1 tablet by mouth 2 (two) times daily as needed. Acute pain in addition to chronic pain    Dispense:  60 tablet    Refill:  0    Historical information moved to improve visibility of documentation.    Past Medical History:  Diagnosis Date  . Low libido 10/05/2016   Past Surgical History:  Procedure Laterality Date  . APPENDECTOMY     Social History   Tobacco Use  . Smoking status: Former Games developer  . Smokeless tobacco: Former Engineer, water Use Topics  . Alcohol use: No    Alcohol/week: 0.0 standard drinks   family history includes Heart disease in his father.  Medications: Current Outpatient Medications  Medication Sig Dispense Refill  . gabapentin (NEURONTIN) 300 MG capsule One tab PO qHS for a week, then BID for a week, then TID. May double weekly to a max of 3,600mg /day 180 capsule 3  . [START ON 03/07/2018] HYDROcodone-acetaminophen (NORCO/VICODIN) 5-325 MG tablet Take 1 tablet by mouth 2 (two) times daily as needed. Acute pain in addition to chronic pain 60 tablet 0  . sildenafil (REVATIO) 20 MG tablet TAKE ONE - TWO TABLETS BY MOUTH PRIOR TO ONSET OF SEXUAL ACTIVITY. 50 tablet 8   No current facility-administered medications for this visit.  No Known Allergies    Discussed warning signs or symptoms. Please see discharge instructions. Patient expresses understanding.  I personally was present and performed or re-performed the history, physical exam and medical decision-making activities of this service and have verified that the service and findings are accurately documented in the student's note. ___________________________________________ Clementeen Graham M.D., ABFM., CAQSM. Primary Care and Sports Medicine Adjunct Instructor of Family Medicine  [employer]

## 2018-03-01 NOTE — Patient Instructions (Signed)
Thank you for coming in today. Get xray on the way out.  You should hear from chronic pain clinic.  Recheck in 4 weeks or so Continue home exercises.

## 2018-04-09 ENCOUNTER — Encounter: Payer: Self-pay | Admitting: Family Medicine

## 2018-04-09 ENCOUNTER — Ambulatory Visit (INDEPENDENT_AMBULATORY_CARE_PROVIDER_SITE_OTHER): Payer: Managed Care, Other (non HMO) | Admitting: Family Medicine

## 2018-04-09 VITALS — BP 135/86 | HR 79 | Ht 71.0 in | Wt 203.0 lb

## 2018-04-09 DIAGNOSIS — F331 Major depressive disorder, recurrent, moderate: Secondary | ICD-10-CM | POA: Diagnosis not present

## 2018-04-09 DIAGNOSIS — L739 Follicular disorder, unspecified: Secondary | ICD-10-CM

## 2018-04-09 DIAGNOSIS — F411 Generalized anxiety disorder: Secondary | ICD-10-CM | POA: Diagnosis not present

## 2018-04-09 MED ORDER — DULOXETINE HCL 30 MG PO CPEP: 30.0000 mg | ORAL_CAPSULE | Freq: Every day | ORAL | 1 refills | Status: DC

## 2018-04-09 MED ORDER — MUPIROCIN 2 % EX OINT: TOPICAL_OINTMENT | CUTANEOUS | 3 refills | Status: AC

## 2018-04-09 MED ORDER — DOXYCYCLINE HYCLATE 100 MG PO TABS: 100.0000 mg | ORAL_TABLET | Freq: Two times a day (BID) | ORAL | 0 refills | Status: DC

## 2018-04-09 NOTE — Progress Notes (Signed)
Michael Castaneda is a 37 y.o. male who [name] today for anxiety/depression symptoms, and folliculitis.  Clinic notes a 1 to 2-week history of small nonhealing sores or pimples on his extremities and scalp.  He denies any pain fevers or chills.  Is not tried much treatment yet.  Feels well otherwise.  He does note however history of anxiety and depressive symptoms.  He notes they are worsening recently.  He does not take any medication for anxiety or depression.  Is not tried any medicine in the past for this as well.  He notes his symptoms are now interfering with his quality of life.  He denies any active SI or HI.   ROS as above:  Exam:  BP 135/86   Pulse 79   Ht 5\' 11"  (1.803 m)   Wt 203 lb (92.1 kg)   BMI 28.31 kg/m  Wt Readings from Last 5 Encounters:  04/09/18 203 lb (92.1 kg)  03/01/18 203 lb (92.1 kg)  01/01/18 193 lb (87.5 kg)  09/21/17 230 lb (104.3 kg)  07/25/17 186 lb (84.4 kg)    Gen: Well NAD HEENT: EOMI,  MMM Lungs: Normal work of breathing. CTABL Heart: RRR no MRG Abd: NABS, Soft. Nondistended, Nontender Exts: Brisk capillary refill, warm and well perfused.  Psych alert and oriented normal speech thought process and affect.  No active SI or HI expressed. Skin: Small erythematous papules and pustules present on extremities and scalp consistent in appearance with folliculitis.  Depression screen [facility] 04/09/2018 09/21/2017 07/29/2015 05/27/2015 05/11/2015  Decreased Interest 2 1 0 0 0  Down, Depressed, Hopeless 3 2 0 0 0  PHQ - 2 Score 5 3 0 0 0  Altered sleeping 3 3 - - -  Tired, decreased energy 3 3 - - -  Change in appetite 2 1 - - -  Feeling bad or failure about yourself  2 2 - - -  Trouble concentrating 3 1 - - -  Moving slowly or fidgety/restless 2 1 - - -  Suicidal thoughts 1 2 - - -  PHQ-9 Score 21 16 - - -  Difficult  doing work/chores Very difficult Very difficult - - -    GAD 7 : Generalized Anxiety Score 04/09/2018 09/21/2017  Nervous, Anxious, on Edge 3 2  Control/stop worrying 3 2  Worry too much - different things 2 2  Trouble relaxing 2 2  Restless 2 3  Easily annoyed or irritable 3 2  Afraid - awful might happen 1 1  Total GAD 7 Score 16 14  Anxiety Difficulty Very difficult Very difficult      Lab and Radiology Results No results found for this or any previous visit (from the past 72 hour(s)). No results found.    Assessment and Plan: 37 y.o. male with  Folliculitis: Treat with doxycycline chlorhexidine body wash and mupirocin antibiotic ointment.  Recheck in about 3 weeks.  Anxiety and depression: Moderate to severe symptoms.  Plan to start treatment with Cymbalta.  Patient has coexisting pain complaint which may be helped by Cymbalta.  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  Plan to recheck in about 3 weeks.  In addition recommend counseling.   No orders of the defined types were placed in this encounter.  Meds ordered this encounter  Medications  . DULoxetine (CYMBALTA) 30 MG capsule  Sig: Take 1 capsule (30 mg total) by mouth daily.    Dispense:  30 capsule    Refill:  1  . doxycycline (VIBRA-TABS) 100 MG tablet    Sig: Take 1 tablet (100 mg total) by mouth 2 (two) times daily.    Dispense:  14 tablet    Refill:  0  . mupirocin ointment (BACTROBAN) 2 %    Sig: Apply nasal twice daily for 1 week    Dispense:  30 g    Refill:  3     Historical information moved to improve visibility of documentation.  Past Medical History:  Diagnosis Date  . Low libido 10/05/2016   Past Surgical History:  Procedure Laterality Date  . APPENDECTOMY     Social History   Tobacco Use  . Smoking status: Former Games developermoker  . Smokeless tobacco: Former Engineer, waterUser  Substance Use Topics  . Alcohol use: No    Alcohol/week: 0.0  standard drinks   family history includes Heart disease in his father.  Medications: Current Outpatient Medications  Medication Sig Dispense Refill  . gabapentin (NEURONTIN) 300 MG capsule One tab PO qHS for a week, then BID for a week, then TID. May double weekly to a max of 3,600mg /day 180 capsule 3  . HYDROcodone-acetaminophen (NORCO/VICODIN) 5-325 MG tablet Take 1 tablet by mouth 2 (two) times daily as needed. Acute pain in addition to chronic pain 60 tablet 0  . sildenafil (REVATIO) 20 MG tablet TAKE ONE - TWO TABLETS BY MOUTH PRIOR TO ONSET OF SEXUAL ACTIVITY. 50 tablet 8  . doxycycline (VIBRA-TABS) 100 MG tablet Take 1 tablet (100 mg total) by mouth 2 (two) times daily. 14 tablet 0  . DULoxetine (CYMBALTA) 30 MG capsule Take 1 capsule (30 mg total) by mouth daily. 30 capsule 1  . mupirocin ointment (BACTROBAN) 2 % Apply nasal twice daily for 1 week 30 g 3   No current facility-administered medications for this visit.    No Known Allergies   Discussed warning signs or symptoms. Please see discharge instructions. Patient expresses understanding.

## 2018-04-09 NOTE — Patient Instructions (Addendum)
Thank you for coming in today. I think you have folliculitis.  Take the doxycycline twice daily for 1 week.  Use over the counter chlorhexadine body wash for 1-2 weeks daily and then every once in a while.  Use the nasal ointment for 1 week or so.   Start cymbalta daily.  Recheck in 3 weeks.   Duloxetine delayed-release capsules What is this medicine? DULOXETINE (doo LOX e teen) is used to treat depression, anxiety, and different types of chronic pain. This medicine may be used for other purposes; ask your health care provider or pharmacist if you have questions. COMMON BRAND NAME(S): Cymbalta, Irenka What should I tell my health care provider before I take this medicine? They need to know if you have any of these conditions: -bipolar disorder or a family history of bipolar disorder -glaucoma -kidney disease -liver disease -suicidal thoughts or a previous suicide attempt -taken medicines called MAOIs like Carbex, Eldepryl, Marplan, Nardil, and Parnate within 14 days -an unusual reaction to duloxetine, other medicines, foods, dyes, or preservatives -pregnant or trying to get pregnant -breast-feeding How should I use this medicine? Take this medicine by mouth with a glass of water. Follow the directions on the prescription label. Do not cut, crush or chew this medicine. You can take this medicine with or without food. Take your medicine at regular intervals. Do not take your medicine more often than directed. Do not stop taking this medicine suddenly except upon the advice of your doctor. Stopping this medicine too quickly may cause serious side effects or your condition may worsen. A special MedGuide will be given to you by the pharmacist with each prescription and refill. Be sure to read this information carefully each time. Talk to your pediatrician regarding the use of this medicine in children. While this drug may be prescribed for children as young as 687 years of age for selected  conditions, precautions do apply. Overdosage: If you think you have taken too much of this medicine contact a poison control center or emergency room at once. NOTE: This medicine is only for you. Do not share this medicine with others. What if I miss a dose? If you miss a dose, take it as soon as you can. If it is almost time for your next dose, take only that dose. Do not take double or extra doses. What may interact with this medicine? Do not take this medicine with any of the following medications: -desvenlafaxine -levomilnacipran -linezolid -MAOIs like Carbex, Eldepryl, Marplan, Nardil, and Parnate -methylene blue (injected into a vein) -milnacipran -thioridazine -venlafaxine This medicine may also interact with the following medications: -alcohol -amphetamines -aspirin and aspirin-like medicines -certain antibiotics like ciprofloxacin and enoxacin -certain medicines for blood pressure, heart disease, irregular heart beat -certain medicines for depression, anxiety, or psychotic disturbances -certain medicines for migraine headache like almotriptan, eletriptan, frovatriptan, naratriptan, rizatriptan, sumatriptan, zolmitriptan -certain medicines that treat or prevent blood clots like warfarin, enoxaparin, and dalteparin -cimetidine -fentanyl -lithium -NSAIDS, medicines for pain and inflammation, like ibuprofen or naproxen -phentermine -procarbazine -rasagiline -sibutramine -St. John's wort -theophylline -tramadol -tryptophan This list may not describe all possible interactions. Give your health care provider a list of all the medicines, herbs, non-prescription drugs, or dietary supplements you use. Also tell them if you smoke, drink alcohol, or use illegal drugs. Some items may interact with your medicine. What should I watch for while using this medicine? Tell your doctor if your symptoms do not get better or if they get worse. Visit your  doctor or health care professional  for regular checks on your progress. Because it may take several weeks to see the full effects of this medicine, it is important to continue your treatment as prescribed by your doctor. Patients and their families should watch out for new or worsening thoughts of suicide or depression. Also watch out for sudden changes in feelings such as feeling anxious, agitated, panicky, irritable, hostile, aggressive, impulsive, severely restless, overly excited and hyperactive, or not being able to sleep. If this happens, especially at the beginning of treatment or after a change in dose, call your health care professional. Bonita Quin may get drowsy or dizzy. Do not drive, use machinery, or do anything that needs mental alertness until you know how this medicine affects you. Do not stand or sit up quickly, especially if you are an older patient. This reduces the risk of dizzy or fainting spells. Alcohol may interfere with the effect of this medicine. Avoid alcoholic drinks. This medicine can cause an increase in blood pressure. This medicine can also cause a sudden drop in your blood pressure, which may make you feel faint and increase the chance of a fall. These effects are most common when you first start the medicine or when the dose is increased, or during use of other medicines that can cause a sudden drop in blood pressure. Check with your doctor for instructions on monitoring your blood pressure while taking this medicine. Your mouth may get dry. Chewing sugarless gum or sucking hard candy, and drinking plenty of water may help. Contact your doctor if the problem does not go away or is severe. What side effects may I notice from receiving this medicine? Side effects that you should report to your doctor or health care professional as soon as possible: -allergic reactions like skin rash, itching or hives, swelling of the face, lips, or tongue -anxious -breathing problems -confusion -changes in vision -chest  pain -confusion -elevated mood, decreased need for sleep, racing thoughts, impulsive behavior -eye pain -fast, irregular heartbeat -feeling faint or lightheaded, falls -feeling agitated, angry, or irritable -hallucination, loss of contact with reality -high blood pressure -loss of balance or coordination -palpitations -redness, blistering, peeling or loosening of the skin, including inside the mouth -restlessness, pacing, inability to keep still -seizures -stiff muscles -suicidal thoughts or other mood changes -trouble passing urine or change in the amount of urine -trouble sleeping -unusual bleeding or bruising -unusually weak or tired -vomiting -yellowing of the eyes or skin Side effects that usually do not require medical attention (report to your doctor or health care professional if they continue or are bothersome): -change in sex drive or performance -change in appetite or weight -constipation -dizziness -dry mouth -headache -increased sweating -nausea -tired This list may not describe all possible side effects. Call your doctor for medical advice about side effects. You may report side effects to FDA at 1-800-FDA-1088. Where should I keep my medicine? Keep out of the reach of children. Store at room temperature between 20 and 25 degrees C (68 to 77 degrees F). Throw away any unused medicine after the expiration date. NOTE: This sheet is a summary. It may not cover all possible information. If you have questions about this medicine, talk to your doctor, pharmacist, or health care provider.  2019 Elsevier/Gold Standard (2015-08-27 18:16:03)    Folliculitis  Folliculitis is inflammation of the hair follicles. Folliculitis most commonly occurs on the scalp, thighs, legs, back, and buttocks. However, it can occur anywhere on the body. What  are the causes? This condition may be caused by:  A bacterial infection (common).  A fungal infection.  A viral  infection.  Coming into contact with certain chemicals, especially oils and tars.  Shaving or waxing.  Applying greasy ointments or creams to your skin often. Long-lasting folliculitis and folliculitis that keeps coming back can be caused by bacteria that live in the nostrils. What increases the risk? This condition is more likely to develop in people with:  A weakened immune system.  Diabetes.  Obesity. What are the signs or symptoms? Symptoms of this condition include:  Redness.  Soreness.  Swelling.  Itching.  Small white or yellow, pus-filled, itchy spots (pustules) that appear over a reddened area. If there is an infection that goes deep into the follicle, these may develop into a boil (furuncle).  A group of closely packed boils (carbuncle). These tend to form in hairy, sweaty areas of the body. How is this diagnosed? This condition is diagnosed with a skin exam. To find what is causing the condition, your health care provider may take a sample of one of the pustules or boils for testing. How is this treated? This condition may be treated by:  Applying warm compresses to the affected areas.  Taking an antibiotic medicine or applying an antibiotic medicine to the skin.  Applying or bathing with an antiseptic solution.  Taking an over-the-counter medicine to help with itching.  Having a procedure to drain any pustules or boils. This may be done if a pustule or boil contains a lot of pus or fluid.  Laser hair removal. This may be done to treat long-lasting folliculitis. Follow these instructions at home:  If directed, apply heat to the affected area as often as told by your health care provider. Use the heat source that your health care provider recommends, such as a moist heat pack or a heating pad. ? Place a towel between your skin and the heat source. ? Leave the heat on for 20-30 minutes. ? Remove the heat if your skin turns bright red. This is especially  important if you are unable to feel pain, heat, or cold. You may have a greater risk of getting burned.  If you were prescribed an antibiotic medicine, use it as told by your health care provider. Do not stop using the antibiotic even if you start to feel better.  Take over-the-counter and prescription medicines only as told by your health care provider.  Do not shave irritated skin.  Keep all follow-up visits as told by your health care provider. This is important. Get help right away if:  You have more redness, swelling, or pain in the affected area.  Red streaks are spreading from the affected area.  You have a fever. This information is not intended to replace advice given to you by your health care provider. Make sure you discuss any questions you have with your health care provider. Document Released: 06/06/2001 Document Revised: 10/16/2015 Document Reviewed: 01/16/2015 Elsevier Interactive Patient Education  2019 [employer]

## 2018-04-13 ENCOUNTER — Telehealth: Payer: Self-pay

## 2018-04-13 MED ORDER — PROMETHAZINE HCL 25 MG PO TABS: 25.0000 mg | ORAL_TABLET | Freq: Four times a day (QID) | ORAL | 2 refills | Status: AC | PRN

## 2018-04-13 NOTE — Telephone Encounter (Signed)
Phenergan sent to [facility] pharmacy for nausea and vomiting

## 2018-04-13 NOTE — Telephone Encounter (Signed)
Patient wife has been advised. Rhonda Cunningham,CMA  

## 2018-04-13 NOTE — Telephone Encounter (Signed)
Patient wife called stated that he was given medication on his visit 04/09/2018 and he has been very nauseous and not having a appetite. Patient is requesting something be called in to his pharmacy. Please advise. Rhonda Cunningham,CMA

## 2018-04-30 ENCOUNTER — Telehealth: Payer: Self-pay

## 2018-04-30 ENCOUNTER — Ambulatory Visit: Payer: Managed Care, Other (non HMO) | Admitting: Family Medicine

## 2018-04-30 MED ORDER — DOXYCYCLINE HYCLATE 100 MG PO TABS: 100.0000 mg | ORAL_TABLET | Freq: Two times a day (BID) | ORAL | 0 refills | Status: DC

## 2018-04-30 MED ORDER — DULOXETINE HCL 30 MG PO CPEP: 30.0000 mg | ORAL_CAPSULE | Freq: Every day | ORAL | 1 refills | Status: DC

## 2018-04-30 NOTE — Telephone Encounter (Signed)
Refilled Cymbalta for 30 days and doxycycline.  Was given for folliculitis I did go ahead and give a refill today until he is able to get in with Dr. Denyse Amass.

## 2018-04-30 NOTE — Telephone Encounter (Addendum)
Called patient to advise that medication was refilled but ws unable to reach the patient. I  made 3 attempts to reach patient by phone.Michael Castaneda Freyja Govea,CMA

## 2018-04-30 NOTE — Telephone Encounter (Signed)
PT needs a refill on Vibra-Tabs & Cymbalta. Pharmacy on file is correct. PT will make f/up appt with Dr. Denyse Amass through [facility] when he gets his schedule.

## 2018-05-07 ENCOUNTER — Encounter: Payer: Self-pay | Admitting: Family Medicine

## 2018-05-07 ENCOUNTER — Ambulatory Visit (INDEPENDENT_AMBULATORY_CARE_PROVIDER_SITE_OTHER): Payer: Managed Care, Other (non HMO) | Admitting: Family Medicine

## 2018-05-07 VITALS — BP 131/82 | HR 76 | Ht 70.0 in | Wt 203.0 lb

## 2018-05-07 DIAGNOSIS — F411 Generalized anxiety disorder: Secondary | ICD-10-CM | POA: Diagnosis not present

## 2018-05-07 DIAGNOSIS — F331 Major depressive disorder, recurrent, moderate: Secondary | ICD-10-CM | POA: Diagnosis not present

## 2018-05-07 DIAGNOSIS — L700 Acne vulgaris: Secondary | ICD-10-CM

## 2018-05-07 MED ORDER — BUSPIRONE HCL 10 MG PO TABS: 10.0000 mg | ORAL_TABLET | Freq: Three times a day (TID) | ORAL | 1 refills | Status: AC | PRN

## 2018-05-07 MED ORDER — BUPROPION HCL ER (XL) 150 MG PO TB24: 150.0000 mg | ORAL_TABLET | ORAL | 1 refills | Status: AC

## 2018-05-07 MED ORDER — DOXYCYCLINE HYCLATE 100 MG PO TABS: 50.0000 mg | ORAL_TABLET | Freq: Two times a day (BID) | ORAL | 2 refills | Status: AC

## 2018-05-07 NOTE — Patient Instructions (Signed)
Thank you for coming in today. Restart Wellbutrin,. STOP Cymbalta.  Start Buspar three times daily as needed for anxiety.  Switch Doxycycline to 50mg  (1/2 pill) twice daily for body acne suppression.  Be careful with sun.   Keep me updated.  Ideally recheck in 6 weeks or so.   Consider Genesight test.

## 2018-05-07 NOTE — Progress Notes (Signed)
Michael Castaneda is a 38 y.o. male who [facility] today for follow-up mood, and folliculitis.  Michael Castaneda has a history of depression and anxiety symptoms.  He has been on bupropion in the past but not much for anxiety.  About a month ago he was started on Cymbalta.  He notes that helped his depression anxiety symptoms but he did not show side effects.  He felt as though it made him into a bit of a zombie.  He lost motivation or drive and as well had suppressed appetite.  He is continuing to take the medication but would like to try switching to something else.  He notes the majority of his symptoms are anxiety related and he thinks he do well with intermittent as needed based medications.  He would like to restart bupropion as he tolerated that quite well in the past.  Additionally Michael Castaneda notes that about a month ago he had a short course of doxycycline for folliculitis.  This worked quite well but he notes that it did not fully suppress it and he had a repeat course that is ending today.  He notes that his back acne is significantly improved on doxycycline and he wonders if he could take it on a more chronic basis.   ROS as above:  Exam:  BP 131/82   Pulse 76   Ht 5\' 10"  (1.778 m)   Wt 203 lb (92.1 kg)   BMI 29.13 kg/m  Wt Readings from Last 5 Encounters:  05/07/18 203 lb (92.1 kg)  04/09/18 203 lb (92.1 kg)  03/01/18 203 lb (92.1 kg)  01/01/18 193 lb (87.5 kg)  09/21/17 230 lb (104.3 kg)    Gen: Well NAD HEENT: EOMI,  MMM Lungs: Normal work of breathing. CTABL Heart: RRR no MRG Abd: NABS, Soft. Nondistended, Nontender Exts: Brisk capillary refill, warm and well perfused.  Psych alert and oriented normal speech thought process and affect.  [facility] 05/07/2018 04/09/2018 09/21/2017 07/29/2015 05/27/2015  Decreased Interest 3 2 1  0 0  Down,  Depressed, Hopeless 2 3 2  0 0  PHQ - 2 Score 5 5 3  0 0  Altered sleeping 3 3 3  - -  Tired, decreased energy 3 3 3  - -  Change in appetite 3 2 1  - -  Feeling bad or failure about yourself  1 2 2  - -  Trouble concentrating 2 3 1  - -  Moving slowly or fidgety/restless 2 2 1  - -  Suicidal thoughts 1 1 2  - -  PHQ-9 Score 20 21 16  - -  Difficult doing work/chores Very difficult Very difficult Very difficult - -   GAD 7 : Generalized Anxiety Score 05/07/2018 04/09/2018 09/21/2017  Nervous, Anxious, on Edge 2 3 2   Control/stop worrying 2 3 2   Worry too much - different things 2 2 2   Trouble relaxing 2 2 2   Restless 1 2 3   Easily annoyed or irritable 3 3 2   Afraid - awful might happen 1 1 1   Total GAD 7 Score 13 16 14   Anxiety Difficulty Very difficult Very difficult Very difficult      Lab and Radiology Results No results found for this or any previous visit (from the past 72 hour(s)). No results found.    Assessment and Plan: 38 y.o. male with  Mood: Depression and anxiety symptoms.  Discontinue Cymbalta.  Restart bupropion and use BuSpar as needed.  Discussed further options.  Recommend counseling as well.  Additionally consider gene site testing if still struggling to find medications that will work well.  Acne: Switch to lower dose chronic doxycycline.  Warned about sun exposure risk.  Check in about 6 weeks.   No orders of the defined types were placed in this encounter.  Meds ordered this encounter  Medications  . doxycycline (VIBRA-TABS) 100 MG tablet    Sig: Take 0.5 tablets (50 mg total) by mouth 2 (two) times daily.    Dispense:  30 tablet    Refill:  2  . busPIRone (BUSPAR) 10 MG tablet    Sig: Take 1 tablet (10 mg total) by mouth 3 (three) times daily as needed (anxiety).    Dispense:  90 tablet    Refill:  1  . buPROPion (WELLBUTRIN XL) 150 MG 24 hr tablet    Sig: Take 1 tablet (150 mg total) by mouth every morning.    Dispense:  90 tablet    Refill:  1      Historical information moved to improve visibility of documentation.  Past Medical History:  Diagnosis Date  . Low libido 10/05/2016   Past Surgical History:  Procedure Laterality Date  . APPENDECTOMY     Social History   Tobacco Use  . Smoking status: Former Games developer  . Smokeless tobacco: Former Engineer, water Use Topics  . Alcohol use: No    Alcohol/week: 0.0 standard drinks   family history includes Heart disease in his father.  Medications: Current Outpatient Medications  Medication Sig Dispense Refill  . doxycycline (VIBRA-TABS) 100 MG tablet Take 0.5 tablets (50 mg total) by mouth 2 (two) times daily. 30 tablet 2  . HYDROcodone-acetaminophen (NORCO/VICODIN) 5-325 MG tablet Take 1 tablet by mouth 2 (two) times daily as needed. Acute pain in addition to chronic pain 60 tablet 0  . meloxicam (MOBIC) 15 MG tablet     . mupirocin ointment (BACTROBAN) 2 % Apply nasal twice daily for 1 week 30 g 3  . promethazine (PHENERGAN) 25 MG tablet Take 1 tablet (25 mg total) by mouth every 6 (six) hours as needed for nausea or vomiting. 30 tablet 2  . sildenafil (REVATIO) 20 MG tablet TAKE ONE - TWO TABLETS BY MOUTH PRIOR TO ONSET OF SEXUAL ACTIVITY. 50 tablet 8  . buPROPion (WELLBUTRIN XL) 150 MG 24 hr tablet Take 1 tablet (150 mg total) by mouth every morning. 90 tablet 1  . busPIRone (BUSPAR) 10 MG tablet Take 1 tablet (10 mg total) by mouth 3 (three) times daily as needed (anxiety). 90 tablet 1   No current facility-administered medications for this visit.    No Known Allergies   Discussed warning signs or symptoms. Please see discharge instructions. Patient expresses understanding.

## 2018-06-18 ENCOUNTER — Ambulatory Visit: Payer: Managed Care, Other (non HMO) | Admitting: Family Medicine

## 2018-09-23 IMAGING — DX DG LUMBAR SPINE COMPLETE 4+V
5 series · 5 of 5 positions shown · non-contrast
Comparison: None.

CLINICAL DATA: Chronic low back pain and right radiculopathy.

EXAM:
LUMBAR SPINE - COMPLETE 4+ VIEW

[l-spine ap]
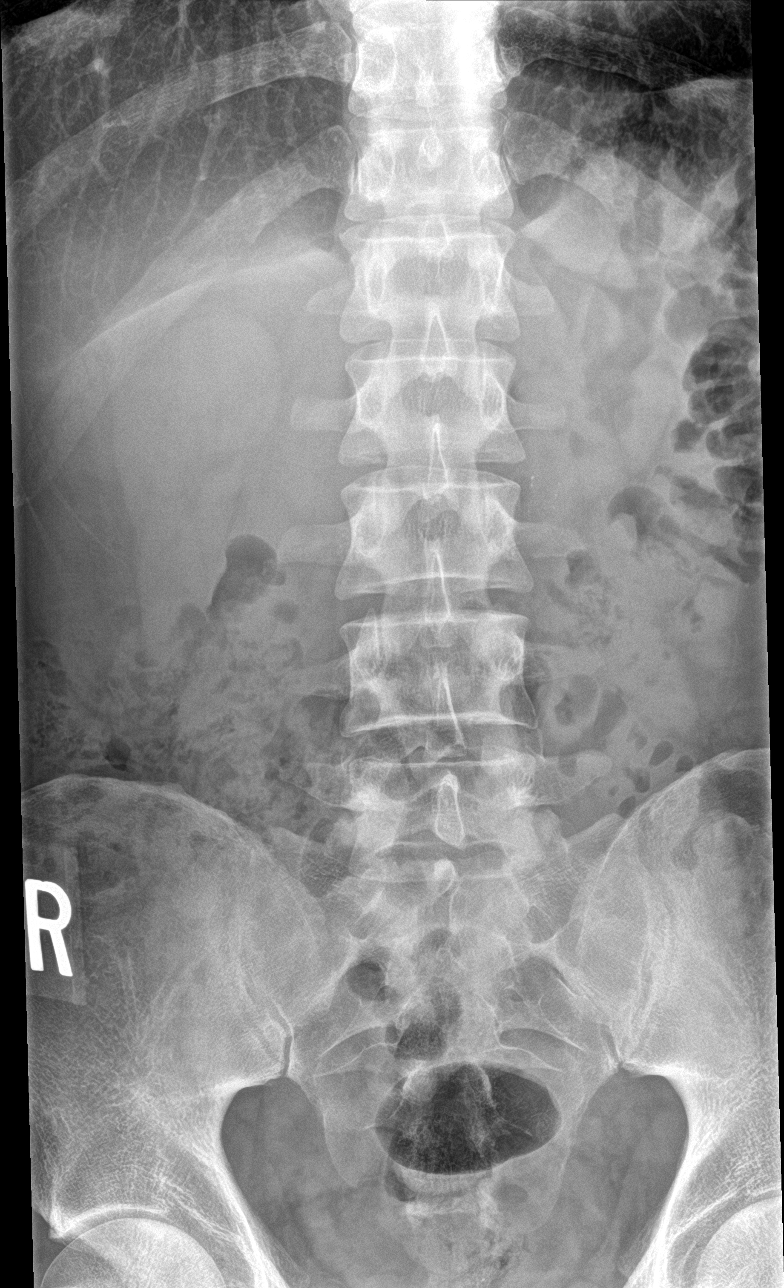

[l-spine obl (1 of 2)]
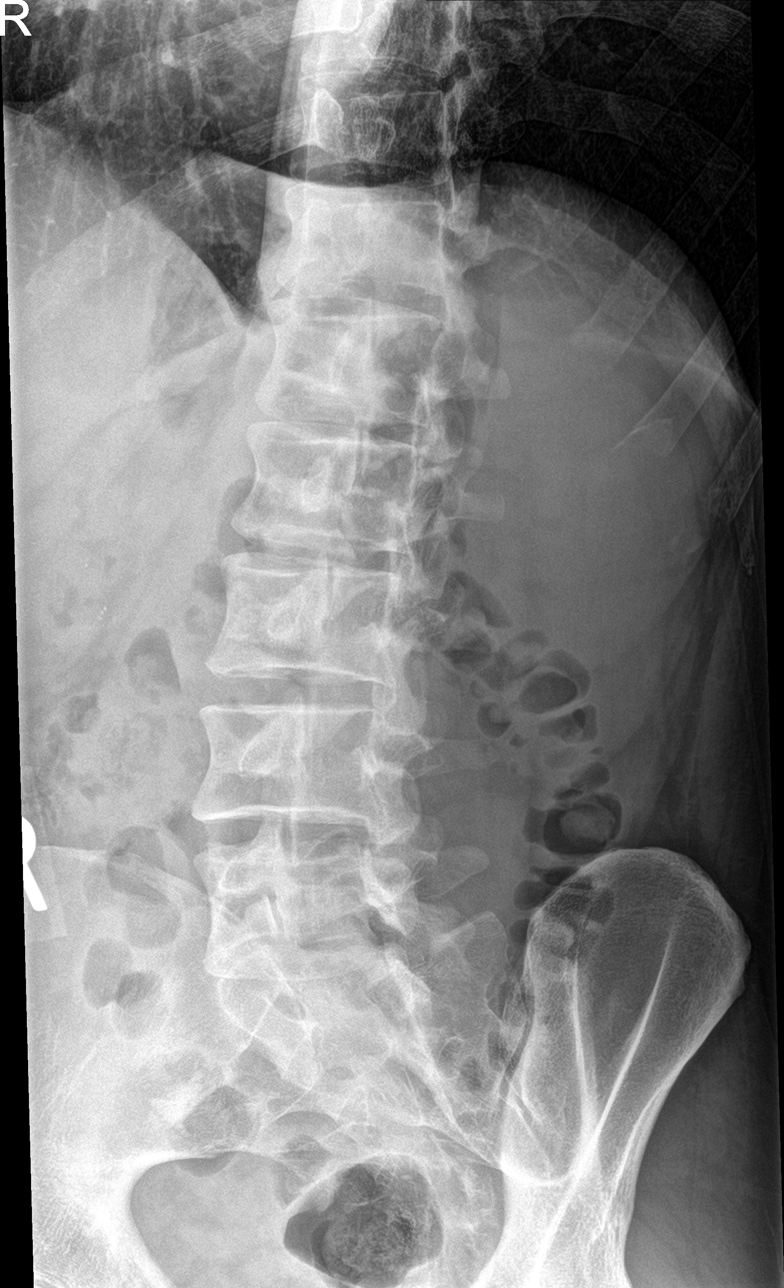

[l-spine obl (2 of 2)]
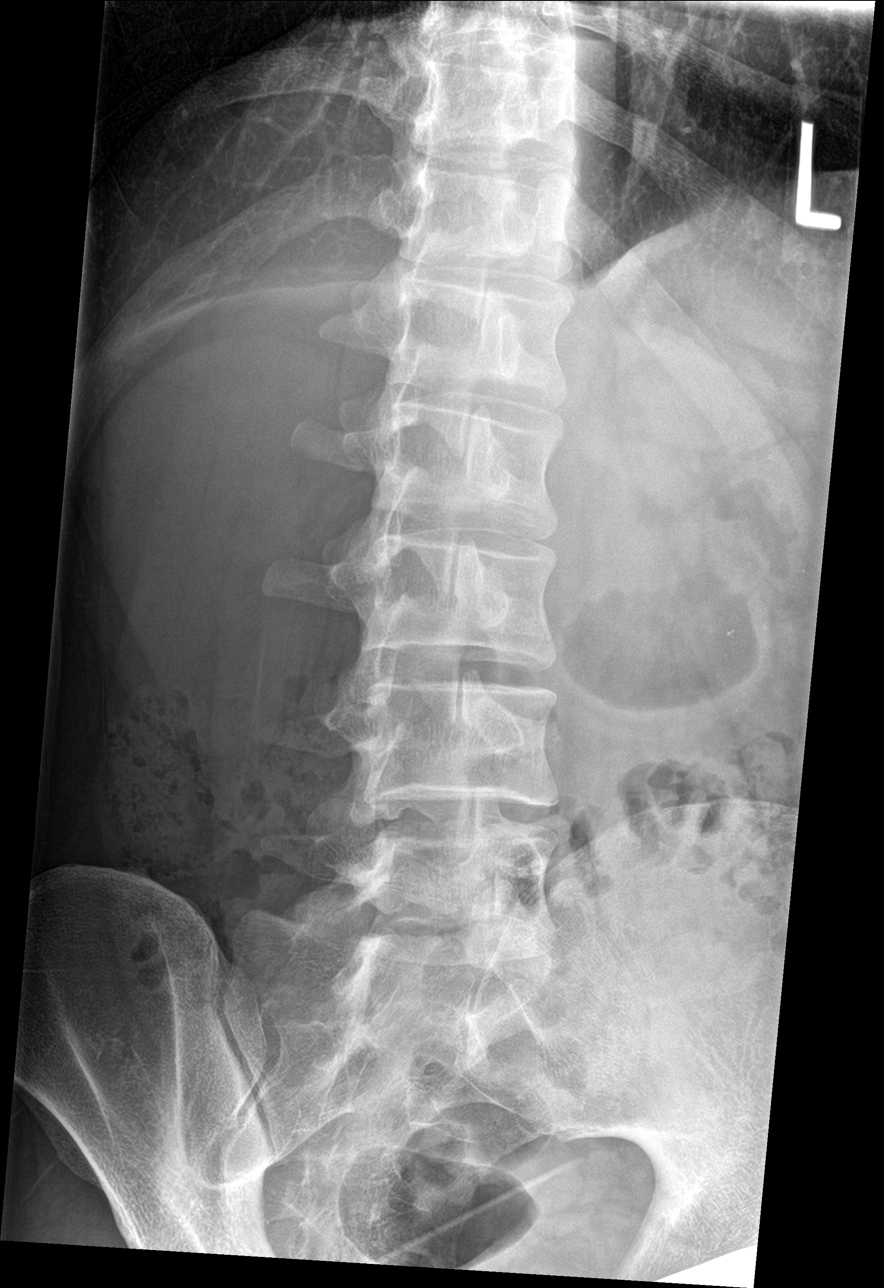

[l-spine lat]
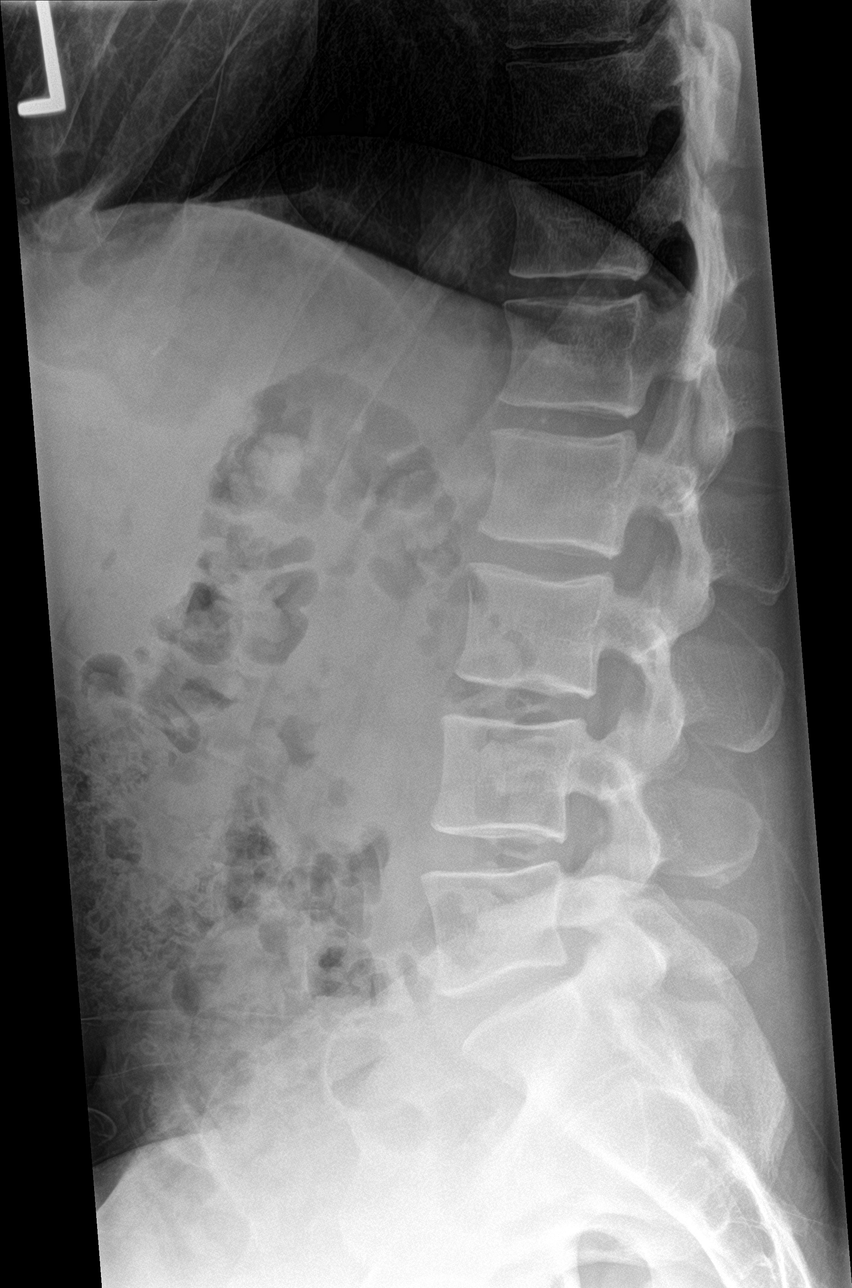

[l-spine spot]
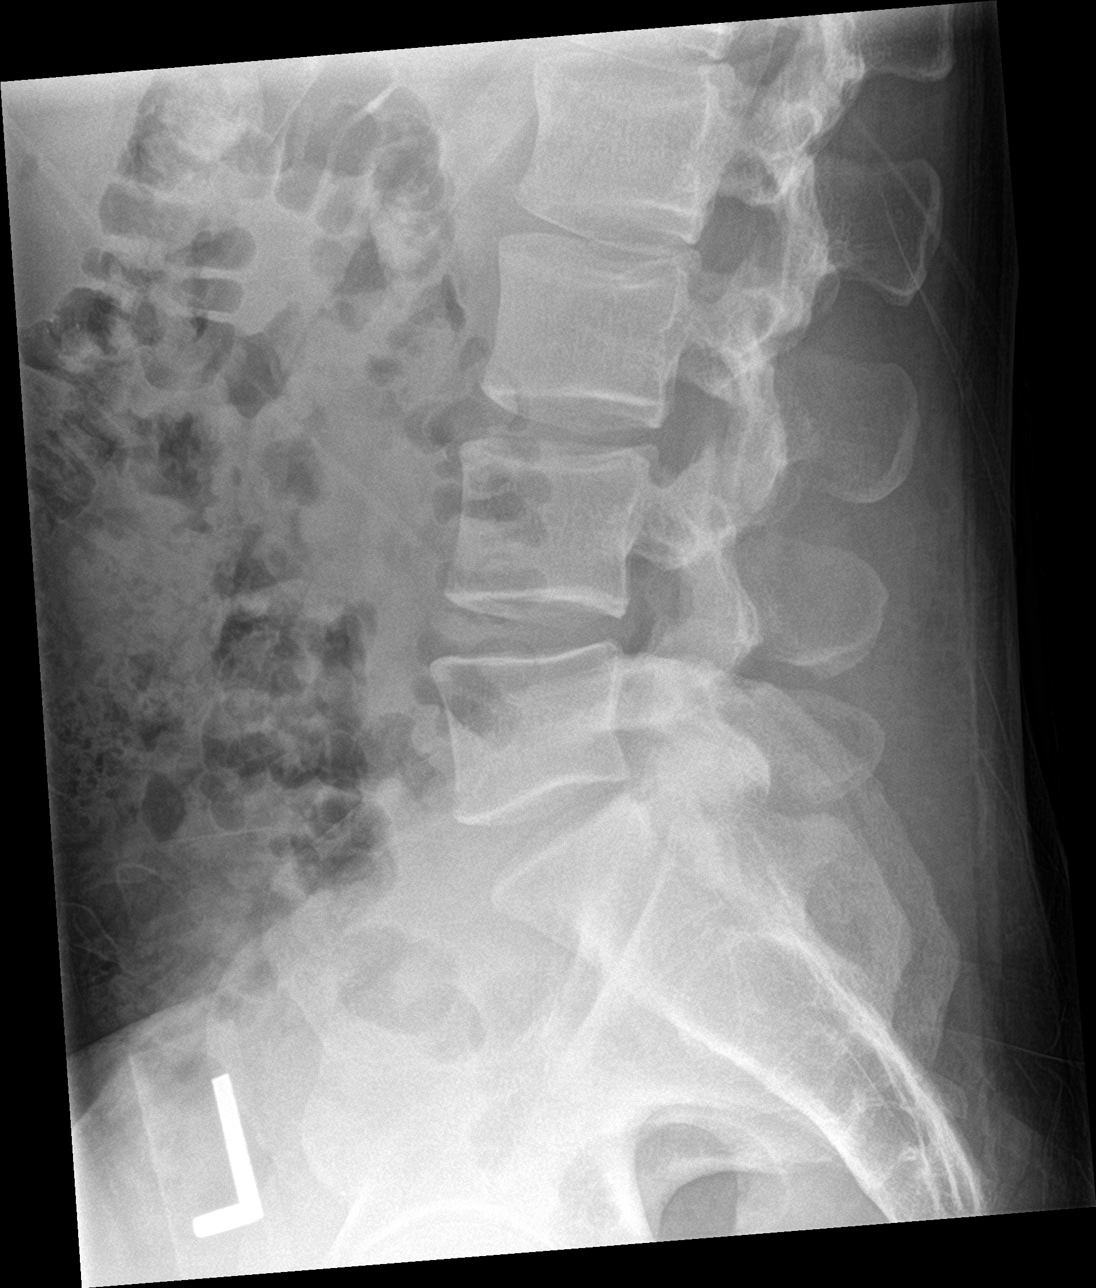

[5 of 5 positions shown; findings below may reference images not displayed]

FINDINGS: There is no evidence of lumbar spine fracture. Alignment is normal.
Intervertebral disc spaces are maintained.
IMPRESSION: Negative.

## 2019-06-23 IMAGING — DX DG ANKLE COMPLETE 3+V*R*
3 series · 3 of 3 positions shown · non-contrast
Comparison: None

CLINICAL DATA: Pa[REDACTED]reased range of motion with attempted dorsal
flexion of RIGHT ankle, multiple injuries to RIGHT ankle playing
basketball in past

EXAM:
RIGHT ANKLE - COMPLETE 3+ VIEW

[ankle ap]
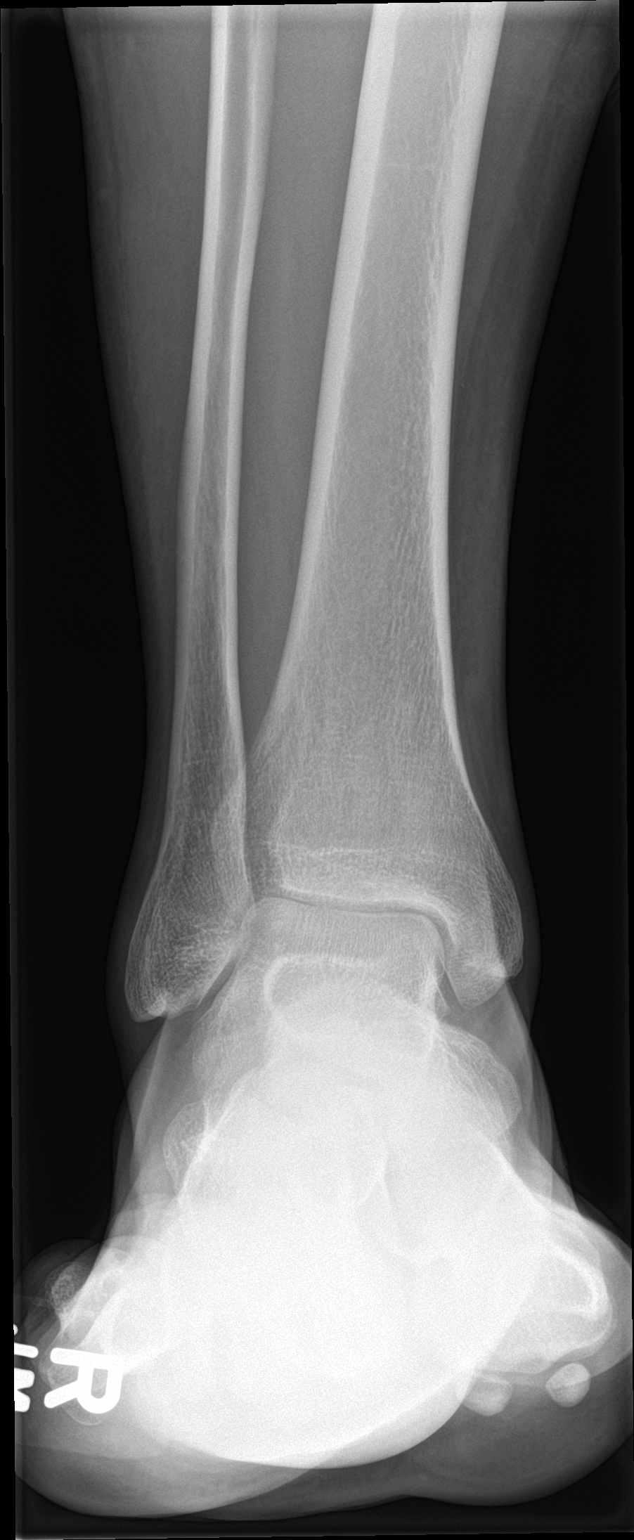

[ankle obl]
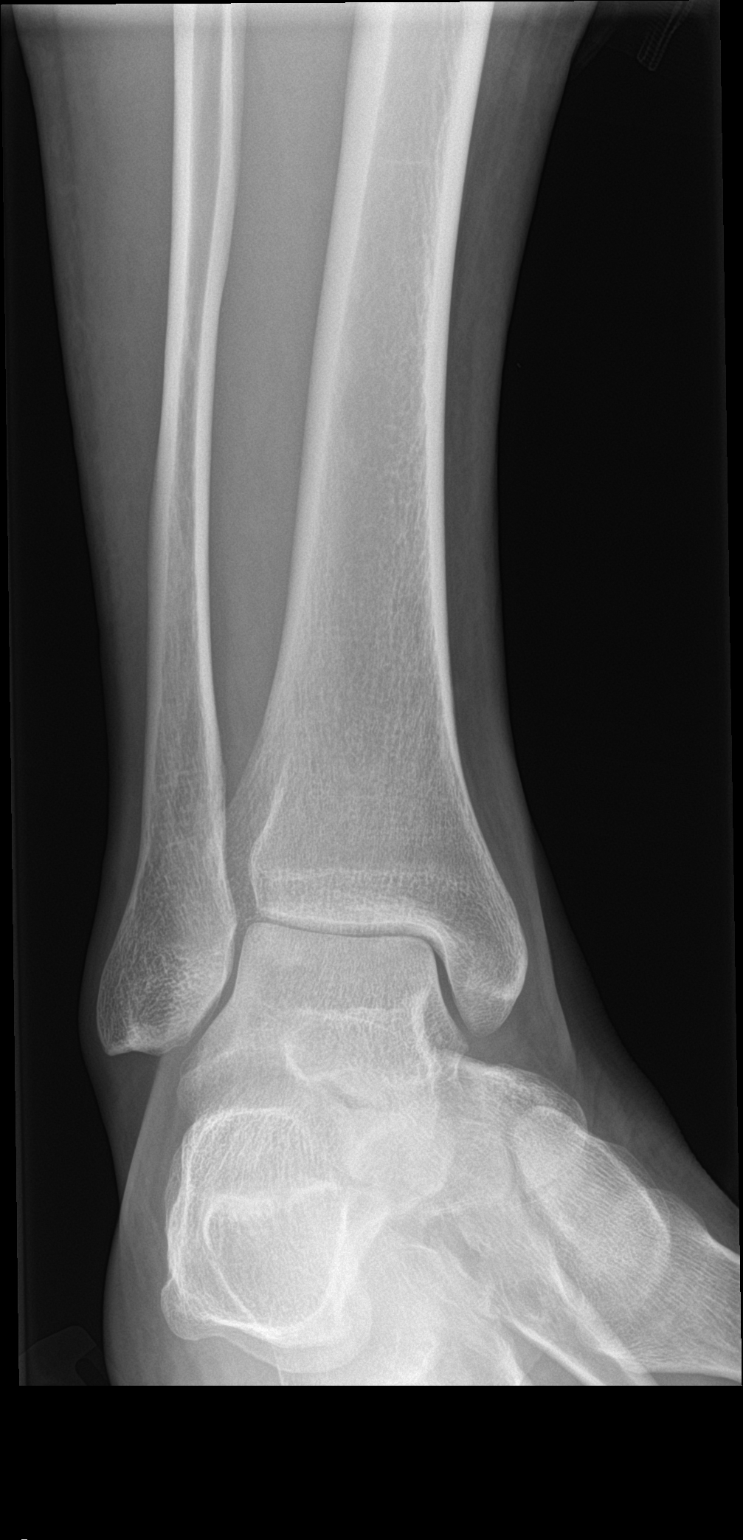

[ankle lat]
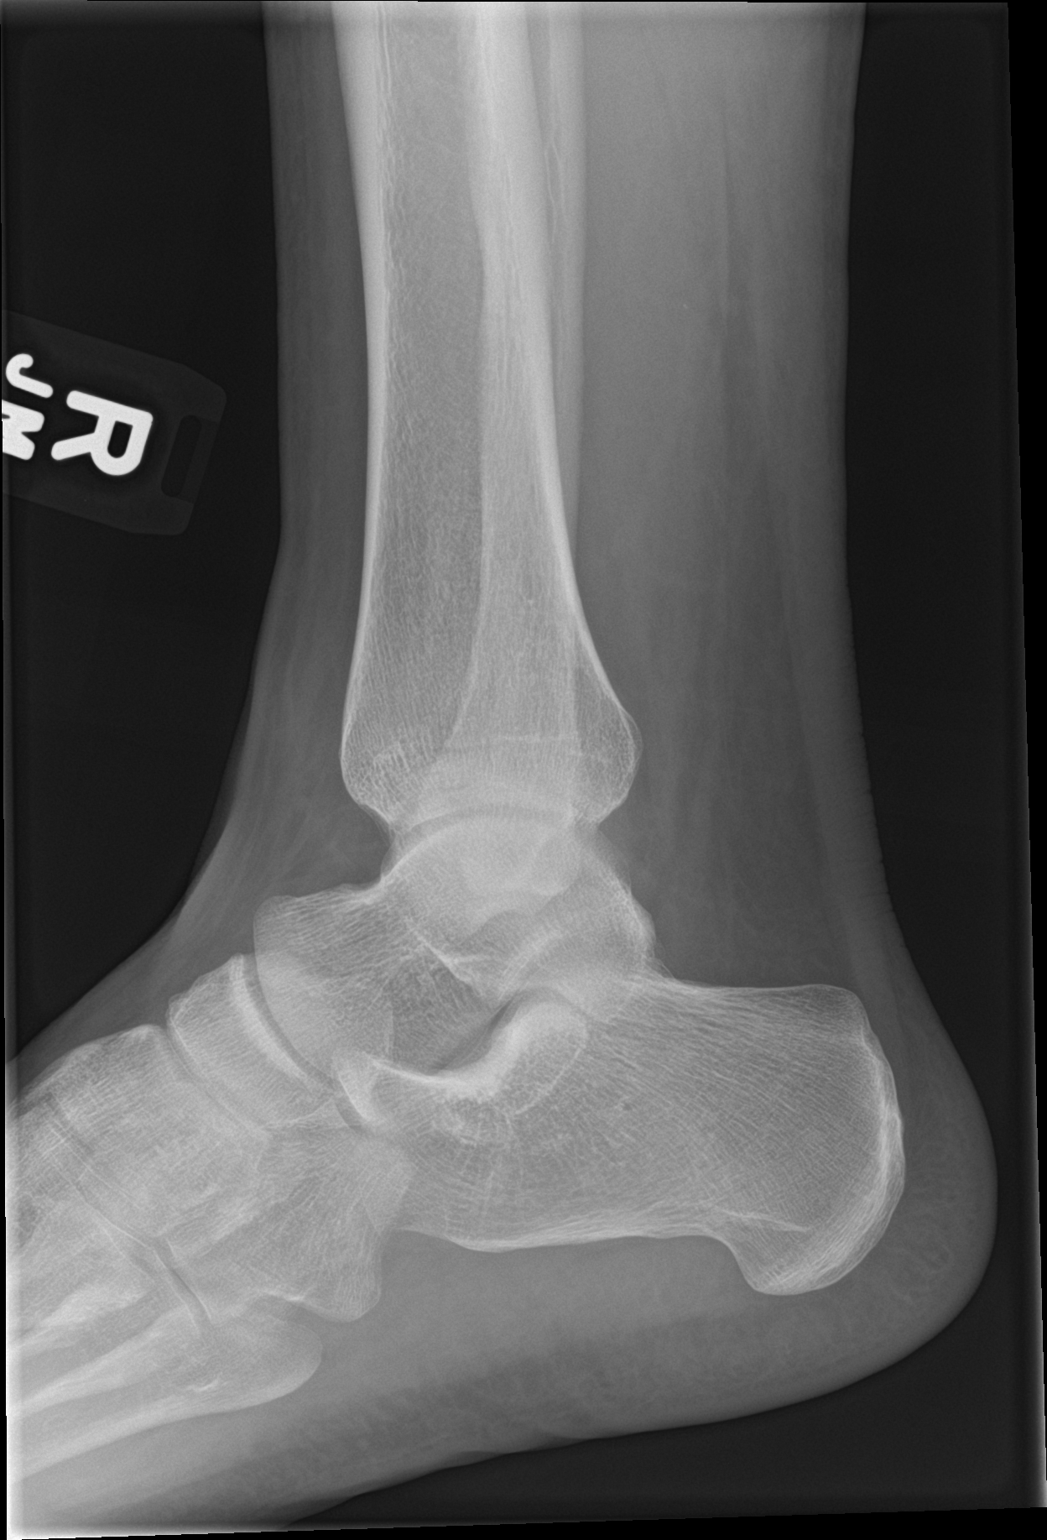

[3 of 3 positions shown; findings below may reference images not displayed]

FINDINGS: There is no evidence of fracture, dislocation, or joint effusion.
There is no evidence of arthropathy or other focal bone abnormality.
Soft tissues are unremarkable.
IMPRESSION: Negative.
